# Patient Record
Sex: Female | Born: 1970 | Race: White | Hispanic: No | Marital: Married | Smoking: Former smoker
Health system: Southern US, Community
[De-identification: ages and names within clinical notes are randomized; demographics above are authoritative.]

## PROBLEM LIST (undated history)

## (undated) HISTORY — PX: TONSILLECTOMY: SUR1361

---

## 1998-01-22 ENCOUNTER — Other Ambulatory Visit: Admission: RE | Admit: 1998-01-22 | Discharge: 1998-01-22 | Payer: Self-pay | Admitting: Obstetrics and Gynecology

## 1999-02-01 ENCOUNTER — Other Ambulatory Visit: Admission: RE | Admit: 1999-02-01 | Discharge: 1999-02-01 | Payer: Self-pay | Admitting: Obstetrics and Gynecology

## 1999-09-04 ENCOUNTER — Inpatient Hospital Stay (HOSPITAL_COMMUNITY): Admission: AD | Admit: 1999-09-04 | Discharge: 1999-09-06 | Payer: Self-pay | Admitting: Obstetrics and Gynecology

## 1999-09-22 ENCOUNTER — Encounter: Admission: RE | Admit: 1999-09-22 | Discharge: 1999-10-27 | Payer: Self-pay | Admitting: *Deleted

## 1999-10-04 ENCOUNTER — Other Ambulatory Visit: Admission: RE | Admit: 1999-10-04 | Discharge: 1999-10-04 | Payer: Self-pay | Admitting: Obstetrics and Gynecology

## 2000-09-18 ENCOUNTER — Encounter: Admission: RE | Admit: 2000-09-18 | Discharge: 2000-11-03 | Payer: Self-pay | Admitting: Anesthesiology

## 2000-10-22 ENCOUNTER — Other Ambulatory Visit: Admission: RE | Admit: 2000-10-22 | Discharge: 2000-10-22 | Payer: Self-pay | Admitting: Obstetrics and Gynecology

## 2001-11-13 ENCOUNTER — Other Ambulatory Visit: Admission: RE | Admit: 2001-11-13 | Discharge: 2001-11-13 | Payer: Self-pay | Admitting: Obstetrics and Gynecology

## 2002-12-08 ENCOUNTER — Other Ambulatory Visit: Admission: RE | Admit: 2002-12-08 | Discharge: 2002-12-08 | Payer: Self-pay | Admitting: Obstetrics and Gynecology

## 2005-05-18 ENCOUNTER — Encounter: Admission: RE | Admit: 2005-05-18 | Discharge: 2005-05-18 | Payer: Self-pay | Admitting: Obstetrics and Gynecology

## 2006-07-17 ENCOUNTER — Emergency Department (HOSPITAL_COMMUNITY): Admission: EM | Admit: 2006-07-17 | Discharge: 2006-07-17 | Payer: Self-pay | Admitting: Family Medicine

## 2008-06-16 ENCOUNTER — Encounter: Admission: RE | Admit: 2008-06-16 | Discharge: 2008-06-16 | Payer: Self-pay | Admitting: Obstetrics and Gynecology

## 2009-06-18 ENCOUNTER — Encounter: Admission: RE | Admit: 2009-06-18 | Discharge: 2009-06-18 | Payer: Self-pay | Admitting: Obstetrics and Gynecology

## 2010-05-31 ENCOUNTER — Other Ambulatory Visit: Payer: Self-pay | Admitting: Obstetrics and Gynecology

## 2010-05-31 DIAGNOSIS — Z1231 Encounter for screening mammogram for malignant neoplasm of breast: Secondary | ICD-10-CM

## 2010-06-28 ENCOUNTER — Ambulatory Visit
Admission: RE | Admit: 2010-06-28 | Discharge: 2010-06-28 | Disposition: A | Discharge status: Home / Self Care | Payer: BC Managed Care – PPO | Source: Ambulatory Visit | Attending: Obstetrics and Gynecology | Admitting: Obstetrics and Gynecology

## 2010-06-28 DIAGNOSIS — Z1231 Encounter for screening mammogram for malignant neoplasm of breast: Secondary | ICD-10-CM

## 2010-07-22 NOTE — Procedures (Signed)
[facility]  Patient:    Gail Lindsey, Gail Lindsey                         MRN: [id number] Proc. Date: 09/26/00 Adm. Date:  [id number] Attending:  Thyra Breed CC:         Candy Sledge, M.D.   Procedure Report  PROCEDURE:  Piriformis trigger point injection.  DIAGNOSIS:  Buttock pain, most likely on the basis of piriformis syndrome.  INTERVAL HISTORY:  The patient noted marked improvement after her last visit where we injected the piriformis muscle.  We also partially blocked the sciatic nerve at her last visit.  She did well up until the point where she reduced the amount of Neurontin she was taking per day and, as a result, her symptoms have come back somewhat.  She has not been in to [facility] yet but plans to get involved with them.  PHYSICAL EXAMINATION:  Blood pressure 107/47.  Heart rate is 62.  Respiratory rate 18.  O2 saturations 100%.  The patient demonstrated a contusion from her previous injection site with neuro exam grossly unchanged.  DESCRIPTION OF PROCEDURE:  After informed consent was obtained, the patient was placed in the left lateral decubitus position with the left leg extended and her right flexed at the hip and knee.  A line was drawn from the right trochanteric region to the posterior superior iliac spine.  At the mid point of this line, a perpendicular line was drawn down to intersect the second line drawn from the trochanteric region down to the sacral hiatus.  Pressure over this area elicited discomfort.  It was identical to the place where she had previously been injected, as marked by the small contusion.  The area was marked and prepped with Betadine x 3.  Using a 27 gauge needle, I anesthetized the skin and subcutaneous structures with 1% lidocaine.  A 25 gauge spinal needle was introduced down to approximate the piriformis muscle.  I injected 6 mL of local anesthetic.  The local anesthetic consisted of 1%  lidocaine mixed with 0.5% levobupivacaine in a 1:1 ratio.  The needle was removed, and the patient noted reduction in her discomfort.  POSTPROCEDURE CONDITION:  Stable.  DISCHARGE INSTRUCTIONS: 1. Resume previous diet. 2. Limitations of activities per instruction sheet. 3. Continue on current medications. 4. The patient was encouraged to follow through with physical therapy. 5. Follow up with me in 1-2 weeks to consider a repeat injection.  The patient    was advised that there was no need to do the injections if she is not    getting physical therapy. DD:  09/26/00 TD:  09/26/00 Job: [id number]

## 2010-07-22 NOTE — H&P (Signed)
[place]  Patient:    Gail Lindsey, Gail Lindsey                         MRN: [id number] Adm. Date:  09/04/99 Attending:  Lenoard Aden, M.D. [facility]                         History and Physical  CHIEF COMPLAINT:  Worsening sciatica and favorable cervix, for induction.  HISTORY OF PRESENT ILLNESS:  The patient is a 40 year old white female, GII, PI, with EDD of 09/13/99 at 39-weeks gestation, for induction for favorable cervix nd worsening sciatica.  Pregnancy course uncomplicated.  ALLERGIES:  TETRACYCLINE and AMOXICILLIN.  MEDICATIONS:  Prenatal vitamins.  OBSTETRICAL HISTORY:  7 lb 8 oz female in 1998.  PAST SURGICAL HISTORY:  No other medical or surgical hospitalizations except for tonsillectomy.  FAMILY HISTORY:  Thyroidectomy and skin cancer.  PREGNANCY DATA:  Remarkable for blood type A positive, Rh antibody negative, toxo- titers negative, VDRL nonreactive, rubella immune, hepatitis-B surface antigen ______, GBS positive.  PHYSICAL EXAMINATION:  GENERAL:  Well-developed, well-nourished white female.  HEENT:  Normal.  LUNGS:  Clear.  HEART:  Regular rhythm.  ABDOMEN:  Soft, gravid, nontender; estimated fetal weight 7.5 pounds.  PELVIC:  Cervix 3 cm and 70% vertex and -1 station.  Artificial rupture of membranes - clear.  NEUROLOGIC:  Exam nonfocal.  IMPRESSION: 1. 39-week intrauterine pregnancy. 2. Worsening sciatica. 3. Favorable cervix.  PLAN:  Proceed with Pitocin induction. DD:  09/04/99 TD:  09/04/99 Job: [id number]

## 2010-07-22 NOTE — Procedures (Signed)
[facility]  Patient:    Gail Lindsey, Gail Lindsey                         MRN: [id number] Proc. Date: 10/10/00 Adm. Date:  [id number] Attending:  Thyra Breed CC:         Candy Sledge, M.D.   Procedure Report  PROCEDURE:  Piriformis trigger point injection of the right piriformis.  INTERVAL HISTORY:  The patient has been to integrative therapies and is getting physical therapy. She has done much better as a result of this. She has gotten some padding from [employer] which has helped also. She is doing exercises at home as well as going to physical therapy three times a week.  PHYSICAL EXAMINATION:  Blood pressure 115/77, heart rate 108, respiratory rate 18, O2 saturations 98%, pain level is 5/10. Her neuro exam is unchanged from her last visit. She is still tender over the right piriformis muscle.  DESCRIPTION OF PROCEDURE:  After informed consent was obtained, the patient was placed in the left lateral decubitus position with her left leg extended and right flexed at the hip and knee. I identified the piriformis region and marked the skin overlying this. The area was prepped with Betadine x 3. A skin wheal was raised with a 27 gauge needle using a 0.5 cc of 1% lidocaine. A 25 gauge needle was introduced to approximate the piriformis muscle and a total of 8 cc of local anesthetic was injected. The local anesthetic consisted of 4 cc of 1% lidocaine mixed with 4 cc of 0.5% levobupivacaine. The patient tolerated this well. She did develop a bit of a sciatic nerve block and was advised that she would need to be careful for the next 12-18 hours with regard to the nerve block. I plan to see her back in follow-up in three weeks for consideration for repeat block. In the meantime, she is to continue with her physical therapy. Her limitations were clearly outlined to her by the nurse assisting me and her father was made aware of the limitations. DD:   10/10/00 TD:  10/11/00 Job: [id number]

## 2010-07-22 NOTE — Procedures (Signed)
[facility]  Patient:    Gail Lindsey, Gail Lindsey Visit Number: [id number] MRN: [id number]          Service Type: PMG Location: [facility] Attending Physician:  Thyra Breed Proc. Date: 10/31/00 Adm. Date:  09/18/2000   CC:         Candy Sledge, M.D.   Procedure Report  PROCEDURE:  Right piriformis muscle injection.  DIAGNOSIS:  Piriformis syndrome.  INTERVAL HISTORY:  The patient has continued with physiotherapy and has noted that this has significantly reduced her pain.  She rates her pain at 3/10. She feels very encouraged with her progress to date.  PHYSICAL EXAMINATION:  The patient is afebrile with vital signs stable.  Neuro exam is grossly unchanged from her last visit.  DESCRIPTION OF PROCEDURE:  After informed consent was obtained, the patient was placed in the left lateral decubitus position with the left leg extended and her right flexed at the hip and knee.  A line was drawn from the greater trochanter to the posterior superior iliac spine.  At the mid point of this line, a perpendicular line was drawn to intersect the second line from the great trochanter to the sacral hiatus.  Pressure over this area elicited discomfort.  The area was marked and prepped with Betadine x 3.  A skin well was raised with a 27 gauge needle using 1% lidocaine.  A 25 gauge needle was introduced down to approximate the piriformis muscle.  I injected 4 cc of a local anesthetic.  Ten minutes later, the patient felt that she did not have a complete block, and I injected an additional 2 cc of local anesthetic. Following this, the patient noted good improvement in her pain, but she did have some weakness suggesting that she had a partial sciatic nerve block.  POSTPROCEDURE CONDITION:  Stable with weakness.  DISPOSITION: 1. Resume previous diet. 2. Limitations and activities as outlined.  She was advised that she should    not walk around and should just basically stay  in bed until the block wears    off through the course of today.  She is aware of the limitations. 3. Continue on current medications. 4. Follow up with me in six weeks for a repeat injection.  For the time being,    I advised her that I wanted her to continue with physiotherapy, as outlined    previously. Attending Physician:  Thyra Breed DD:  10/31/00 TD:  10/31/00 Job: [id number]

## 2010-07-22 NOTE — Consult Note (Signed)
[facility]  Patient:    Gail Lindsey, Gail Lindsey                         MRN: [id number] Proc. Date: 09/19/00 Adm. Date:  [id number] Attending:  Thyra Breed                          Consultation Report  NEW PATIENT EVALUATION AND PROCEDURE  HISTORY OF PRESENT ILLNESS:  Gail Lindsey is a very pleasant 40 year old who was sent to [place] by Dr. Noreene Filbert for evaluation and consideration of SI joint injection versus other interventional therapies for her bilateral buttock pain. The patient stated that she was in her usual state of health up until 1998 when she had an epidural placed for labor and developed a horrendous headache following this which required infusions of caffeine and eventually a blood patch. She had another labor in July of 2001 and shortly thereafter developed problems with buttock discomfort. Her epidural for that labor was noneventful and successful. About three to four days following delivery, she developed right buttock pain. She was evaluated by Dr. Noreene Filbert who placed her in physical therapy for piriformis syndrome and she improved. Following the physical therapy, she used a test ball for massage as well as her husband massaging her and took Darvocet p.r.n. About six months later, the patient noted that she was requiring two Darvocet per day and was concerned and was reevaluated by Dr. Noreene Filbert who started her on Neurontin 300 mg q. 8h which was not successful in controlling her discomfort and raised this to 400 mg q. 6h. This would take the edge off. She has had EMG and MRIs of her back which were essentially described as normal.  She describes her pain as a throbbing discomfort from the buttock region down the back of the legs to the ankles. There is sometimes numbness and tingling associated with this. She could not identify any exacerbating activities but does note that the Neurontin as well as massage over her buttock helped to reduce her  discomfort as well as the exercise she has been given. She denied any weakness or bowel or bladder incontinence. She does note that the pain is much worse in the evening and will often necessitate getting out of bed to do exercises before she can calm it down.  As a result of her pain, she is much more irritable with regard to caring for her children. She did have her postpartum course complicated by postpartum depression for which she took Zoloft.  CURRENT MEDICATIONS:  Zoloft, Neurontin 400 mg q. 6h, Triv[place], Centrum and calcium.  ALLERGIES:  The patient lists TETRACYCLINE as an allergy because she was given tetracycline as a child and it stained her teeth but otherwise denied any drug allergies.  FAMILY HISTORY:  Positive for coronary artery disease, thyroid disorder, melanoma and hypertension.  ACTIVE MEDICAL PROBLEMS:  Postpartum depression for which she is in the process of coming off of Zoloft.  SOCIAL HISTORY:  The patient drinks 12 ounces of beer per day and smokes two cigarettes per day. She is a Futures trader.  PAST SURGICAL HISTORY:  Significant for foot surgery as well as tonsillectomy.  REVIEW OF SYSTEMS:  GENERAL:  Significant for cold intolerance. HEAD: Negative. EYES:  Significant for floaters. NOSE/MOUTH/THROAT:  Negative. EARS:  Negative. PULMONARY: Negative. CARDIOVASCULAR:  Negative. GI: Significant for loose stools on Neurontin. GU:  Negative. MUSCULOSKELETAL AND NEUROLOGIC: See HPI. HEMATOLOGIC:  Negative. ENDOCRINE: Negative. CUTANEOUS:  Negative. PSYCHIATRIC: Positive for irritability and history of postpartum depression. ALLERGY/IMMUNOLOGIC:  Negative.  PHYSICAL EXAMINATION:  VITAL SIGNS:  Blood pressure 108/60, heart rate 64, respiratory rate 16, O2 saturations 98% and pain level is 5/10.  GENERAL:  This is a pleasant, healthy appearing female in no acute distress.  HEENT:  Head was normocephalic, atraumatic. Eyes, extraocular movements intact with  conjunctivae and sclerae clear. Nose patent nares without discharge. Oropharynx was free of lesions.  NECK:  Supple without lymphadenopathy. Carotids are 2+ and symmetric without bruits.  LUNGS:  Clear.  HEART:  Regular rate and rhythm.  BREASTS/ABDOMINAL/PELVIC/RECTAL:  Not performed.  BACK:  Revealed mild scoliosis predominantly in the thoracic spine with minimal tenderness over the SI joint today. She did exhibit tenderness over the sciatic notches bilaterally and pressure over the approximated piriformis muscles elicited tenderness right greater than left to a very significant extent.  Internal rotation of her legs did not exacerbate her discomfort.  EXTREMITIES:  No cyanosis, clubbing or edema with radial pulses and dorsalis pedis pulse 2+ and symmetric.  NEUROLOGIC:  The patient was oriented to person, place, time, and reason for visit.  Cranial nerves II-XII are grossly intact. Deep tendon reflexes were symmetric in the upper and lower extremity with downgoing toes. Motor was 5/5 with symmetric bulk and tone. Sensation was intact scratch sense and vibratory sense. Coordination was grossly intact.  IMPRESSION: 1. Bilateral buttock pain with radiation of pain down to the ankles which    suggest that there is some element of sciatic with normal nerve conduction    studies and normal MRI of the back suggesting this may be coming from    piriformis syndrome with history of good response to physical therapy. 2. Postpartum depression.  DISPOSITION: 1. I discussed with the patient treatment options and the mechanisms of what    is going on with her as closely as I could define it and both she and her    husbands questions were answered. I advised them that I would like to do a    trial of piriformis trigger point injections initially and get her    into physical therapy over at [facility] therapies. She does have an    element of scoliosis and I advised her that physical  therapy might help to    a mild extent with regard to this. She does exhibit some cold intolerance    today and I advised her that at some point she may need to get her thyroid     studies obtained. 2. She exhibited the exercises that she was counselled with and I encouraged    her to continue with this but would like to send her back to integrative    therapies or over to [facility] for another trial of physical therapy if    needs be. I advised that doing trigger point injections without physical    therapy tends to be counter productive. She is understanding of this    and realizes that she needs to go through with this. She will check with    her insurance and see if she is covered at integrative therapies and if    she is not, we will send her back to [facility]. 3. Piriformis injection. After informed consent was obtained, I placed the    patient in the left lateral decubitus position and monitored her. Her left    leg was extended and her right  flexed at the hip and knee. I identified    the greater trochanter and the posterior superior iliac spine. A line    drawn from these two point was made and at the midpoint of this line, a    perpendicular line was drawn to intersect her line from the greater    trochanter to the sacral hiatus. Pressure over this area elicited exquisite    discomfort and the area was marked. The area marked was prepped with    Betadine x 3 and then insufflated with a 27 gauge needle using 1%    lidocaine. A 25 gauge needle was introduced to approximate the piriformis    muscle on the right side. Aspiration was negative for blood. I injected    4 cc of local anesthetic. Ten minutes later, the patient noted incomplete    relief and I reprepped her and using a 25 gauge needle reapproximated the    piriformis muscle and injected an additional 3 cc of local anesthetic.    The patient noted improvement in her leg discomfort.  The local  anesthetic consisted of 1% lidocaine mixed with 0.5% levobupivacaine in a 1:1 ratio.  CONDITION POST PROCEDURE:  Stable.  DISCHARGE INSTRUCTIONS:  Resume previous diet. Limitations in activities per instruction sheet. Continue on current medications. Referral over to physical therapy. Followup with me in one week for repeat piriformis trigger point injection. DD:  09/19/00 TD:  09/19/00 Job: [id number]

## 2011-03-07 HISTORY — PX: INTRAUTERINE DEVICE (IUD) INSERTION: SHX5877

## 2011-05-22 ENCOUNTER — Other Ambulatory Visit: Payer: Self-pay | Admitting: Obstetrics and Gynecology

## 2011-05-22 DIAGNOSIS — Z1231 Encounter for screening mammogram for malignant neoplasm of breast: Secondary | ICD-10-CM

## 2011-06-29 ENCOUNTER — Ambulatory Visit
Admission: RE | Admit: 2011-06-29 | Discharge: 2011-06-29 | Disposition: A | Discharge status: Home / Self Care | Payer: BC Managed Care – PPO | Source: Ambulatory Visit | Attending: Obstetrics and Gynecology | Admitting: Obstetrics and Gynecology

## 2011-06-29 DIAGNOSIS — Z1231 Encounter for screening mammogram for malignant neoplasm of breast: Secondary | ICD-10-CM

## 2012-05-31 ENCOUNTER — Other Ambulatory Visit: Payer: Self-pay

## 2012-05-31 DIAGNOSIS — Z1231 Encounter for screening mammogram for malignant neoplasm of breast: Secondary | ICD-10-CM

## 2012-07-02 ENCOUNTER — Ambulatory Visit
Admission: RE | Admit: 2012-07-02 | Discharge: 2012-07-02 | Disposition: A | Discharge status: Home / Self Care | Payer: BC Managed Care – PPO | Source: Ambulatory Visit

## 2012-07-02 DIAGNOSIS — Z1231 Encounter for screening mammogram for malignant neoplasm of breast: Secondary | ICD-10-CM

## 2012-07-04 ENCOUNTER — Other Ambulatory Visit: Payer: Self-pay | Admitting: Obstetrics and Gynecology

## 2012-07-04 DIAGNOSIS — R928 Other abnormal and inconclusive findings on diagnostic imaging of breast: Secondary | ICD-10-CM

## 2012-07-18 ENCOUNTER — Ambulatory Visit
Admission: RE | Admit: 2012-07-18 | Discharge: 2012-07-18 | Disposition: A | Discharge status: Home / Self Care | Payer: BC Managed Care – PPO | Source: Ambulatory Visit | Attending: Family Medicine | Admitting: Family Medicine

## 2012-07-18 ENCOUNTER — Other Ambulatory Visit: Payer: Self-pay | Admitting: Family Medicine

## 2012-07-18 DIAGNOSIS — R221 Localized swelling, mass and lump, neck: Secondary | ICD-10-CM

## 2012-07-19 ENCOUNTER — Ambulatory Visit
Admission: RE | Admit: 2012-07-19 | Discharge: 2012-07-19 | Disposition: A | Discharge status: Home / Self Care | Payer: BC Managed Care – PPO | Source: Ambulatory Visit | Attending: Obstetrics and Gynecology | Admitting: Obstetrics and Gynecology

## 2012-07-19 DIAGNOSIS — R928 Other abnormal and inconclusive findings on diagnostic imaging of breast: Secondary | ICD-10-CM

## 2013-04-08 ENCOUNTER — Emergency Department
Admission: EM | Admit: 2013-04-08 | Discharge: 2013-04-08 | Disposition: A | Discharge status: Home / Self Care | Payer: BC Managed Care – PPO | Source: Home / Self Care | Attending: Family Medicine | Admitting: Family Medicine

## 2013-04-08 ENCOUNTER — Encounter: Payer: Self-pay | Admitting: Emergency Medicine

## 2013-04-08 DIAGNOSIS — R5381 Other malaise: Secondary | ICD-10-CM

## 2013-04-08 DIAGNOSIS — R531 Weakness: Secondary | ICD-10-CM

## 2013-04-08 DIAGNOSIS — R5383 Other fatigue: Secondary | ICD-10-CM

## 2013-04-08 LAB — POCT MONO SCREEN (KUC): Mono, POC: NEGATIVE

## 2013-04-08 LAB — POCT INFLUENZA A/B
INFLUENZA A, POC: NEGATIVE
INFLUENZA B, POC: NEGATIVE

## 2013-04-08 LAB — POCT CBC W AUTO DIFF (K'VILLE URGENT CARE)

## 2013-04-08 LAB — POCT RAPID STREP A (OFFICE): RAPID STREP A SCREEN: NEGATIVE

## 2013-04-08 MED ORDER — PREDNISONE 50 MG PO TABS: ORAL_TABLET | ORAL | Status: DC

## 2013-04-08 NOTE — ED Provider Notes (Signed)
CSN: [id number]     Arrival date & time 04/08/13  1105 History   First MD Initiated Contact with Patient 04/08/13 1111     Chief Complaint  Patient presents with  . Fatigue  . Generalized Body Aches  . Headache    HPI   Pt presents today with chief complaint of weakness and fatigue.  Pt states that she has had generalized weakness and fatigue over the past 1-2 weeks.  Denies any recent infections.  States that everything hurts all over.  Pt states that she can barely get out of bed every morning.  States that she is otherwise very healthy and active.  No known trauma.  No new medications.  No diarrhea, dysuria, abd pain, nausea, vomiting  Has had some mild frontal headache associated with sxs.  Talked about her sxs with local pharmacist because she thought she had the flu.  Was told that sxs were not consistent with flu.  Was told to take tylenol.  This has been mildly effective in helping sxs.  No numbness or paresthesias.  Denies any family hx/o autoimmune disease or cancer.  Mammogram UTD per pt-normal.  Denies any depressive sxs.   History reviewed. No pertinent past medical history. Past Surgical History  Procedure Laterality Date  . Tonsillectomy     Family History  Problem Relation Age of Onset  . COPD Father    History  Substance Use Topics  . Smoking status: Former Research scientist (life sciences)  . Smokeless tobacco: Not on file  . Alcohol Use: Yes   OB History   Grav Para Term Preterm Abortions TAB SAB Ect Mult Living                 Review of Systems  All other systems reviewed and are negative.    Allergies  Nitrofuran derivatives and Tetracyclines & related  Home Medications   Current Outpatient Rx  Name  Route  Sig  Dispense  Refill  . aspirin 81 MG tablet   Oral   Take 81 mg by mouth daily.         Marland Kitchen BIOTIN PO   Oral   Take by mouth.         Marland Kitchen glucosamine-chondroitin 500-400 MG tablet   Oral   Take 1 tablet by mouth 3 (three) times daily.           BP 115/78  Pulse 78  Temp(Src) 97 F (36.1 C) (Oral)  Resp 18  Ht 5' 7"  (1.702 m)  Wt 136 lb (61.689 kg)  BMI 21.30 kg/m2  SpO2 99% Physical Exam  Constitutional: She appears well-developed and well-nourished.  HENT:  Head: Normocephalic and atraumatic.  Eyes: Conjunctivae are normal. Pupils are equal, round, and reactive to light.  Neck: Normal range of motion. Neck supple.  Cardiovascular: Normal rate and regular rhythm.   Pulmonary/Chest: Effort normal and breath sounds normal.  Abdominal: Soft.  Neurological: She is alert.  Skin: Skin is warm.    ED Course  Procedures (including critical care time) Labs Review Labs Reviewed  RESPIRATORY VIRUS PANEL  COMPREHENSIVE METABOLIC PANEL  TSH  CK  SEDIMENTATION RATE  ROCKY MTN SPOTTED FVR AB, IGG-BLOOD  ROCKY MTN SPOTTED FVR AB, IGM-BLOOD  B. BURGDORFI ANTIBODIES  B. BURGDORFI ANTIBODIES BY WB  ANA  POCT CBC W AUTO DIFF ([facility])  POCT INFLUENZA A/B  POCT RAPID STREP A (OFFICE)  POCT MONO SCREEN ([facility])   Imaging Review No results found.    MDM  1. Fatigue   2. Weakness    Ddx for sxs is very broad including autoimmune, metabolic, infectious causes.  CBC, flu, mono, strep negative today.  WIll check baseline labs including  TSH, CMET, ESR, CK, RMSF, ANA, resp virus panel.  Will place on short course of prednisone for symptomatic treatment.  Discussed general and symptomatic red flags at length.  Also needs to establish PCP.  Follow up pending bloodwork.     Shanda Howells, MD 04/08/13 [phone]

## 2013-04-08 NOTE — ED Notes (Signed)
Pt c/o fatigue, body aches, decreased appetite, and HA x 2 wks.

## 2013-04-09 LAB — COMPREHENSIVE METABOLIC PANEL
ALT: 12 U/L (ref 0–35)
AST: 16 U/L (ref 0–37)
Albumin: 3.9 g/dL (ref 3.5–5.2)
Alkaline Phosphatase: 36 U/L — ABNORMAL LOW (ref 39–117)
BUN: 10 mg/dL (ref 6–23)
CALCIUM: 9.4 mg/dL (ref 8.4–10.5)
CO2: 26 mEq/L (ref 19–32)
Chloride: 104 mEq/L (ref 96–112)
Creat: 0.79 mg/dL (ref 0.50–1.10)
Glucose, Bld: 79 mg/dL (ref 70–99)
POTASSIUM: 4.7 meq/L (ref 3.5–5.3)
Sodium: 136 mEq/L (ref 135–145)
TOTAL PROTEIN: 6.5 g/dL (ref 6.0–8.3)
Total Bilirubin: 0.6 mg/dL (ref 0.2–1.2)

## 2013-04-09 LAB — VITAMIN B12: Vitamin B-12: 355 pg/mL (ref 211–911)

## 2013-04-09 LAB — TSH: TSH: 1.392 u[IU]/mL (ref 0.350–4.500)

## 2013-04-09 LAB — CK: CK TOTAL: 61 U/L (ref 7–177)

## 2013-04-09 LAB — B. BURGDORFI ANTIBODIES BY WB
B BURGDORFERI IGG ABS (IB): NEGATIVE
B burgdorferi IgM Abs (IB): NEGATIVE

## 2013-04-09 LAB — ANA: Anti Nuclear Antibody(ANA): NEGATIVE

## 2013-04-09 LAB — B. BURGDORFI ANTIBODIES: B BURGDORFERI AB IGG+ IGM: 1.11 {ISR} — AB

## 2013-04-09 LAB — SEDIMENTATION RATE: SED RATE: 1 mm/h (ref 0–22)

## 2013-04-11 ENCOUNTER — Telehealth: Payer: Self-pay | Admitting: *Deleted

## 2013-04-11 LAB — ROCKY MTN SPOTTED FVR AB, IGG-BLOOD: RMSF IgG: 0.12 IV

## 2013-04-11 LAB — VITAMIN D PNL(25-HYDRXY+1,25-DIHY)-BLD
VITAMIN D3 1, 25 (OH): 55 pg/mL
Vit D, 25-Hydroxy: 60 ng/mL (ref 30–89)
Vitamin D 1, 25 (OH)2 Total: 55 pg/mL (ref 18–72)

## 2013-04-11 LAB — ROCKY MTN SPOTTED FVR AB, IGM-BLOOD: ROCKY MTN SPOTTED FEVER, IGM: 0.5 IV

## 2013-04-28 ENCOUNTER — Ambulatory Visit (INDEPENDENT_AMBULATORY_CARE_PROVIDER_SITE_OTHER): Payer: BC Managed Care – PPO | Admitting: Infectious Diseases

## 2013-04-28 ENCOUNTER — Other Ambulatory Visit: Payer: Self-pay | Admitting: Infectious Diseases

## 2013-04-28 ENCOUNTER — Encounter: Payer: Self-pay | Admitting: Infectious Diseases

## 2013-04-28 VITALS — BP 125/79 | HR 70 | Temp 98.2°F | Ht 67.0 in | Wt 135.0 lb

## 2013-04-28 DIAGNOSIS — R5383 Other fatigue: Principal | ICD-10-CM

## 2013-04-28 DIAGNOSIS — R5381 Other malaise: Secondary | ICD-10-CM

## 2013-04-28 NOTE — Progress Notes (Signed)
   Subjective:    Patient ID: Gail Lindsey, female    DOB: [birth year], 43 y.o.   MRN: [id number]  HPI 43 yo F with hx of hx of weakness, body aches, and fatigue for 3 months. Aches were mostly waist down. Was having headaches for the prior 2 weeks (now resolved). Also having pain, burning sensation in her legs. This was a significant change for her in that she is usually very high energy.  Thought she had flu- but did not have URI sx. She was seen at urgent care- was tested for mono, strep, flu; all negative. She was started on prednisone which she took for 5 days. She had tests for Lyme+ and was took doxy for 3 days, felt no better.  CK, TSH, ESR, RMSF, ANA all normal/negative. Lyme elisa+, western blot (-).   PMHx- none Pyriformis syndrome after she had her oldest child, managed at [facility] (resolved).   SocHx/FHx reviewed.  Paternal grandmother had arthritis, father- arthritis, copd. Mother 9 still works full time. Maternal uncle melanoma.   Review of Systems  Constitutional: Positive for activity change. Negative for fever, chills, appetite change and unexpected weight change.  Gastrointestinal: Negative for diarrhea and constipation.  Genitourinary: Negative for difficulty urinating.  Musculoskeletal: Negative for joint swelling.  Hematological: Negative for adenopathy.      Objective:   Physical Exam  Constitutional: She is oriented to person, place, and time. She appears well-developed and well-nourished.  HENT:  Mouth/Throat: No oropharyngeal exudate.  Eyes: EOM are normal. Pupils are equal, round, and reactive to light.  Neck: Neck supple.  Cardiovascular: Normal rate, regular rhythm and normal heart sounds.   Pulmonary/Chest: Effort normal and breath sounds normal.  Abdominal: Soft. Bowel sounds are normal. There is no tenderness. There is no rebound.  Musculoskeletal: She exhibits no edema.  Lymphadenopathy:    She has no cervical adenopathy.    She has no  axillary adenopathy.  Neurological: She is alert and oriented to person, place, and time.  Skin: Skin is warm and dry.          Assessment & Plan:

## 2013-04-28 NOTE — Assessment & Plan Note (Addendum)
Spoke with her and her husband at length regarding ddx. Her lab test so far have been unreveling. I explained her lab tests to her and her husband. She had a false+ Lyme elisa, it would be extremely uncommon for her to have Lyme (not endmic in [place] and the test was done in winter when the ticks are hibernating). There is a wide ddx though- she would like to be screened for mono-like illnesses. I also offered to her that this could be menopause. She also asks to be evaluated for multiple sclerosis. I spoke with them at length about this, this is possible but would be out of my diagnostic abilities.  - I will call her back in 1 week. I offered to have her f/u with her PCP to arrange her referrals but sh prefered to have me arrange them.   -She will f/u with her gyn to be eval for menopause.  -She asks for a referral to neuro, I will arrange this.

## 2013-04-29 LAB — EPSTEIN-BARR VIRUS VCA ANTIBODY PANEL
EBV EA IgG: 25 U/mL — ABNORMAL HIGH (ref <9.0)
EBV NA IgG: >600 U/mL — ABNORMAL HIGH (ref <18.0)
EBV VCA IgG: >750 U/mL — ABNORMAL HIGH (ref <18.0)
EBV VCA IgM: <10 U/mL (ref <36.0)

## 2013-04-29 LAB — CMV IGM: CMV IgM: 17.2 AU/mL (ref <30.00)

## 2013-04-29 LAB — TOXOPLASMA GONDII ANTIBODY, IGG: Toxoplasma IgG Ratio: <3 IU/mL (ref <7.2)

## 2013-04-29 LAB — RPR

## 2013-04-29 LAB — TOXOPLASMA GONDII ANTIBODY, IGM

## 2013-05-01 LAB — CYTOMEGALOVIRUS ANTIBODY, IGG: Cytomegalovirus Ab-IgG: <0.2 U/mL (ref <0.60)

## 2013-05-12 ENCOUNTER — Telehealth: Payer: Self-pay | Admitting: Infectious Diseases

## 2013-05-12 ENCOUNTER — Telehealth: Payer: Self-pay | Admitting: *Deleted

## 2013-05-12 NOTE — Telephone Encounter (Signed)
Patient is calling for lab results and interpretation.  She would like to know if there is a need for the neurology referral, or if she should follow up with her GYN d/t hormone regulation.  Patient is requesting a phone call from Dr. Ninetta LightsHatcher.  You can call her at [phone], ok to leave a message. Andree CossHowell, Gulianna Hornsby M, RN

## 2013-05-12 NOTE — Telephone Encounter (Signed)
Attempted to call pt, here mailbox was full.

## 2013-05-13 ENCOUNTER — Telehealth: Payer: Self-pay | Admitting: Infectious Diseases

## 2013-05-13 NOTE — Telephone Encounter (Signed)
Spoke with pt re: her tests. She has been feeling better- less fatigue.  Her EBV Ig were elevated, she could have had mono-like illness from EBV.  She has had w/u for menopause from her OB- FSH 22. 1 (slightly over cutoff), AMH (0.226) peri-menopausal.  She is still having paresthesias- has been improving. If she still has at the end of the month, she will see Neurology.  I will be available if prn.

## 2013-05-14 ENCOUNTER — Telehealth: Payer: Self-pay | Admitting: *Deleted

## 2013-05-14 NOTE — Telephone Encounter (Signed)
Patient called requesting a copy of her lab work mailed to her home, mailed today. She said she did an Therapist, artinternet search on EBV and has questions for the MD. She is requesting that Dr. Ninetta LightsHatcher call her at [phone] when he returns on Monday. Gail Lindsey

## 2013-05-26 ENCOUNTER — Telehealth: Payer: Self-pay | Admitting: Infectious Diseases

## 2013-05-26 NOTE — Telephone Encounter (Signed)
Pt called to discuss her labs. I called her office # that she left, left message.

## 2013-05-27 ENCOUNTER — Telehealth: Payer: Self-pay | Admitting: *Deleted

## 2013-05-27 NOTE — Telephone Encounter (Signed)
Referral is "in process" [facility] to [facility].  Left message with the with the same information.

## 2013-05-28 NOTE — Telephone Encounter (Signed)
Called pt, no response on her cell or work #s. No message left.

## 2013-06-17 ENCOUNTER — Other Ambulatory Visit: Payer: Self-pay

## 2013-06-17 DIAGNOSIS — Z1231 Encounter for screening mammogram for malignant neoplasm of breast: Secondary | ICD-10-CM

## 2013-07-21 ENCOUNTER — Ambulatory Visit
Admission: RE | Admit: 2013-07-21 | Discharge: 2013-07-21 | Disposition: A | Discharge status: Home / Self Care | Payer: BC Managed Care – PPO | Source: Ambulatory Visit

## 2013-07-21 ENCOUNTER — Encounter (INDEPENDENT_AMBULATORY_CARE_PROVIDER_SITE_OTHER): Payer: Self-pay

## 2013-07-21 DIAGNOSIS — Z1231 Encounter for screening mammogram for malignant neoplasm of breast: Secondary | ICD-10-CM

## 2013-08-27 ENCOUNTER — Encounter: Payer: Self-pay | Admitting: Family Medicine

## 2013-08-27 ENCOUNTER — Ambulatory Visit (INDEPENDENT_AMBULATORY_CARE_PROVIDER_SITE_OTHER): Payer: BC Managed Care – PPO | Admitting: Family Medicine

## 2013-08-27 VITALS — BP 116/79 | HR 68 | Temp 98.0°F | Resp 14 | Ht 67.0 in | Wt 136.0 lb

## 2013-08-27 DIAGNOSIS — H9209 Otalgia, unspecified ear: Secondary | ICD-10-CM

## 2013-08-27 DIAGNOSIS — Z13220 Encounter for screening for lipoid disorders: Secondary | ICD-10-CM

## 2013-08-27 DIAGNOSIS — H9203 Otalgia, bilateral: Secondary | ICD-10-CM

## 2013-08-27 DIAGNOSIS — Z0000 Encounter for general adult medical examination without abnormal findings: Secondary | ICD-10-CM

## 2013-08-27 DIAGNOSIS — Z131 Encounter for screening for diabetes mellitus: Secondary | ICD-10-CM

## 2013-08-27 MED ORDER — ANTIPYRINE-BENZOCAINE 5.4-1.4 % OT SOLN: OTIC | Status: AC

## 2013-08-27 NOTE — Patient Instructions (Signed)
Dr. Hommel's General Advice Following Your Complete Physical Exam  The Benefits of Regular Exercise: Unless you suffer from an uncontrolled cardiovascular condition, studies strongly suggest that regular exercise and physical activity will add to both the quality and length of your life.  The World Health Organization recommends 150 minutes of moderate intensity aerobic activity every week.  This is best split over 3-4 days a week, and can be as simple as a brisk walk for just over 35 minutes "most days of the week".  This type of exercise has been shown to lower LDL-Cholesterol, lower average blood sugars, lower blood pressure, lower cardiovascular disease risk, improve memory, and increase one's overall sense of wellbeing.  The addition of anaerobic (or "strength training") exercises offers additional benefits including but not limited to increased metabolism, prevention of osteoporosis, and improved overall cholesterol levels.  How Can I Strive For A Low-Fat Diet?: Current guidelines recommend that 25-35 percent of your daily energy (food) intake should come from fats.  One might ask how can this be achieved without having to dissect each meal on a daily basis?  Switch to skim or 1% milk instead of whole milk.  Focus on lean meats such as ground turkey, fresh fish, baked chicken, and lean cuts of beef as your source of dietary protein.  Limit saturated fat consumption to less than 10% of your daily caloric intake.  Limit trans fatty acid consumption primarily by limiting synthetic trans fats such as partially hydrogenated oils (Ex: fried fast foods).  Substitute olive or vegetable oil for solid fats where possible.  Moderation of Salt Intake: Provided you don't carry a diagnosis of congestive heart failure nor renal failure, I recommend a daily allowance of no more than 2300 mg of salt (sodium).  Keeping under this daily goal is associated with a decreased risk of cardiovascular events, creeping  above it can lead to elevated blood pressures and increases your risk of cardiovascular events.  Milligrams (mg) of salt is listed on all nutrition labels, and your daily intake can add up faster than you think.  Most canned and frozen dinners can pack in over half your daily salt allowance in one meal.    Lifestyle Health Risks: Certain lifestyle choices carry specific health risks.  As you may already know, tobacco use has been associated with increasing one's risk of cardiovascular disease, pulmonary disease, numerous cancers, among many other issues.  What you may not know is that there are medications and nicotine replacement strategies that can more than double your chances of successfully quitting.  I would be thrilled to help manage your quitting strategy if you currently use tobacco products.  When it comes to alcohol use, I've yet to find an "ideal" daily allowance.  Provided an individual does not have a medical condition that is exacerbated by alcohol consumption, general guidelines determine "safe drinking" as no more than two standard drinks for a man or no more than one standard drink for a female per day.  However, much debate still exists on whether any amount of alcohol consumption is technically "safe".  My general advice, keep alcohol consumption to a minimum for general health promotion.  If you or others believe that alcohol, tobacco, or recreational drug use is interfering with your life, I would be happy to provide confidential counseling regarding treatment options.  General "Over The Counter" Nutrition Advice: Postmenopausal women should aim for a daily calcium intake of 1200 mg, however a significant portion of this might already be   provided by diets including milk, yogurt, cheese, and other dairy products.  Vitamin D has been shown to help preserve bone density, prevent fatigue, and has even been shown to help reduce falls in the elderly.  Ensuring a daily intake of 800 Units of  Vitamin D is a good place to start to enjoy the above benefits, we can easily check your Vitamin D level to see if you'd potentially benefit from supplementation beyond 800 Units a day.  Folic Acid intake should be of particular concern to women of childbearing age.  Daily consumption of 400-800 mcg of Folic Acid is recommended to minimize the chance of spinal cord defects in a fetus should pregnancy occur.    For many adults, accidents still remain one of the most common culprits when it comes to cause of death.  Some of the simplest but most effective preventitive habits you can adopt include regular seatbelt use, proper helmet use, securing firearms, and regularly testing your smoke and carbon monoxide detectors.  Sean B. Hommel DO [facility] [address], [address] [place], [place] [address] Phone: [phone]  

## 2013-08-27 NOTE — Progress Notes (Signed)
CC: Gail Gail Lindsey is a 43 y.o. female is here for Establish Care and Annual Exam   Subjective: HPI:  Colonoscopy: No family history of colon cancer encouraged to get screening colonoscopy age 43 Papsmear: Gets this annually in August with her GYN office, last few years have had no abnormalities but it sounds like she did have an abnormality that did not require biopsy years ago. Mammogram: Gets this annually through her GYN office, most recently normal in June    Influenza Vaccine: No current indication encouraged to get this done during flu season Pneumovax: No current indication Td/Tdap: She's up to date until 2022 Zoster: (Start 45 yo)  Very pleasant 43 year old here to establish care. Requesting complete physical exam with her only acute complaint being approximately 3 years of bilateral ear itching. She has seen multiple ear nose and throat physicians and has tried topical steroids, nasal steroids, topical analgesics in a cream base form. She has itching is mild to moderate in severity on a daily basis only received by scratching with a Q-tip. It is localized deep in the canal. It occurred the day after she surfaced during a long scuba dive trip.  Rare alcohol use no tobacco or recreational drug use  Review of Systems - General ROS: negative for - chills, fever, night sweats, weight gain or weight loss Ophthalmic ROS: negative for - decreased vision Psychological ROS: negative for - anxiety or depression ENT ROS: negative for - hearing change, nasal congestion, tinnitus or allergies Hematological and Lymphatic ROS: negative for - bleeding problems, bruising or swollen lymph nodes Breast ROS: negative Respiratory ROS: no cough, shortness of breath, or wheezing Cardiovascular ROS: no chest pain or dyspnea on exertion Gastrointestinal ROS: no abdominal pain, change in bowel habits, or black or bloody stools Genito-Urinary ROS: negative for - genital discharge, genital ulcers,  incontinence or abnormal bleeding from genitals Musculoskeletal ROS: negative for - joint pain or muscle pain Neurological ROS: negative for - headaches or memory loss Dermatological ROS: negative for lumps, mole changes, rash and skin lesion changes  No past medical history on file.  Past Surgical History  Procedure Laterality Date  . Tonsillectomy     Family History  Problem Relation Age of Onset  . COPD Father     History   Social History  . Marital Status: Married    Spouse Name: N/A    Number of Children: N/A  . Years of Education: N/A   Occupational History  . Not on file.   Social History Main Topics  . Smoking status: Former Games developermoker  . Smokeless tobacco: Not on file  . Alcohol Use: Yes  . Drug Use: No  . Sexual Activity: Not on file   Other Topics Concern  . Not on file   Social History Narrative  . No narrative on file     Objective: BP 116/79  Pulse 68  Temp(Src) 98 F (36.7 C) (Oral)  Resp 14  Ht 5\' 7"  (1.702 m)  Wt 136 lb (61.689 kg)  BMI 21.30 kg/m2  General: No Acute Distress HEENT: Atraumatic, normocephalic, conjunctivae normal without scleral icterus.  No nasal discharge, hearing grossly intact, TMs with good landmarks bilaterally with no middle ear abnormalities, posterior pharynx clear without oral lesions. Neck: Supple, trachea midline, no cervical nor supraclavicular adenopathy. Pulmonary: Clear to auscultation bilaterally without wheezing, rhonchi, nor rales. Cardiac: Regular rate and rhythm.  No murmurs, rubs, nor gallops. No peripheral edema.  2+ peripheral pulses bilaterally. Abdomen: Bowel sounds normal.  No masses.  Non-tender without rebound.  Negative Murphy's sign. GU: Deferred to her GYN visits  MSK: Grossly intact, no signs of weakness.  Full strength throughout upper and lower extremities.  Full ROM in upper and lower extremities.  No midline spinal tenderness. Neuro: Gait unremarkable, CN II-XII grossly intact.  C5-C6 Reflex 2/4  Bilaterally, L4 Reflex 2/4 Bilaterally.  Cerebellar function intact. Skin: No rashes. Psych: Alert and oriented to person/place/time.  Thought process normal. No anxiety/depression.   Assessment & Plan: Gail Gail Lindsey was seen today for establish care and annual exam.  Diagnoses and associated orders for this visit:  Annual physical exam  Lipid screening - Lipid panel  Diabetes mellitus screening - BASIC METABOLIC PANEL WITH GFR  Ear pain, bilateral - antipyrine-benzocaine (AURALGAN) otic solution; Two to four drops in affected ear(s) every 1-2 hours only as needed for pain.    Healthy lifestyle interventions including but not limited to regular exercise, a healthy low fat diet, moderation of salt intake, the dangers of tobacco/alcohol/recreational drug use, nutrition supplementation, and accident avoidance were discussed with the patient and a handout was provided for future reference. She's due for routine dyslipidemia and diabetic screening Advised her that she's used the majority of minor mentions for ear discomfort however could try auralgan until she can followup with her ear nose and throat specialist about this matter   Return in about 1 year (around 08/28/2014) for annual physical.

## 2013-08-28 ENCOUNTER — Encounter: Payer: Self-pay | Admitting: *Deleted

## 2013-08-28 ENCOUNTER — Encounter: Payer: Self-pay | Admitting: Family Medicine

## 2013-08-28 LAB — LIPID PANEL
CHOLESTEROL: 195 mg/dL (ref 0–200)
HDL: 53 mg/dL (ref >39)
LDL Cholesterol: 126 mg/dL — ABNORMAL HIGH (ref 0–99)
TRIGLYCERIDES: 82 mg/dL (ref <150)
Total CHOL/HDL Ratio: 3.7 Ratio
VLDL: 16 mg/dL (ref 0–40)

## 2013-08-28 LAB — BASIC METABOLIC PANEL WITH GFR
BUN: 9 mg/dL (ref 6–23)
CO2: 30 meq/L (ref 19–32)
Calcium: 9.4 mg/dL (ref 8.4–10.5)
Chloride: 101 mEq/L (ref 96–112)
Creat: 0.67 mg/dL (ref 0.50–1.10)
GFR, Est African American: >89 mL/min
GFR, Est Non African American: >89 mL/min
GLUCOSE: 76 mg/dL (ref 70–99)
Potassium: 4.2 mEq/L (ref 3.5–5.3)
Sodium: 137 mEq/L (ref 135–145)

## 2013-08-29 NOTE — Progress Notes (Signed)
Patient informed about lab results and understood. SI

## 2013-09-22 ENCOUNTER — Ambulatory Visit (INDEPENDENT_AMBULATORY_CARE_PROVIDER_SITE_OTHER): Payer: BC Managed Care – PPO | Admitting: Family Medicine

## 2013-09-22 ENCOUNTER — Encounter: Payer: Self-pay | Admitting: Family Medicine

## 2013-09-22 VITALS — BP 115/72 | HR 83 | Temp 98.1°F | Ht 67.0 in | Wt 139.0 lb

## 2013-09-22 DIAGNOSIS — N390 Urinary tract infection, site not specified: Secondary | ICD-10-CM

## 2013-09-22 DIAGNOSIS — B9689 Other specified bacterial agents as the cause of diseases classified elsewhere: Secondary | ICD-10-CM

## 2013-09-22 DIAGNOSIS — R350 Frequency of micturition: Secondary | ICD-10-CM

## 2013-09-22 DIAGNOSIS — A499 Bacterial infection, unspecified: Secondary | ICD-10-CM

## 2013-09-22 LAB — POCT URINALYSIS DIPSTICK
Bilirubin, UA: NEGATIVE
Glucose, UA: NEGATIVE
Nitrite, UA: POSITIVE
SPEC GRAV UA: 1.025
Urobilinogen, UA: 0.2
pH, UA: 6.5

## 2013-09-22 MED ORDER — SULFAMETHOXAZOLE-TRIMETHOPRIM 800-160 MG PO TABS: ORAL_TABLET | ORAL | Status: AC

## 2013-09-22 MED ORDER — SULFAMETHOXAZOLE-TRIMETHOPRIM 800-160 MG PO TABS: ORAL_TABLET | ORAL | Status: DC

## 2013-09-22 NOTE — Progress Notes (Signed)
Pt came in today for a nurse visit complaining of frequent urination, burning when urinate, blood in urine and a strong odor x 1 day. Pain is a 7./Georgette Helmer,CMA

## 2013-09-22 NOTE — Progress Notes (Signed)
Urinalysis and symptoms are suggestive of UTI therefore Bactrim has been prescribed I will follow culture.

## 2013-09-23 NOTE — Progress Notes (Signed)
Pt was informed while in the office./Missi Mcmackin,CMA

## 2013-09-25 LAB — URINE CULTURE: Colony Count: >=100000

## 2013-11-21 ENCOUNTER — Encounter: Payer: Self-pay | Admitting: Emergency Medicine

## 2013-11-21 ENCOUNTER — Emergency Department
Admission: EM | Admit: 2013-11-21 | Discharge: 2013-11-21 | Disposition: A | Discharge status: Home / Self Care | Payer: BC Managed Care – PPO | Source: Home / Self Care | Attending: Family Medicine | Admitting: Family Medicine

## 2013-11-21 DIAGNOSIS — J329 Chronic sinusitis, unspecified: Secondary | ICD-10-CM

## 2013-11-21 DIAGNOSIS — B9789 Other viral agents as the cause of diseases classified elsewhere: Secondary | ICD-10-CM

## 2013-11-21 DIAGNOSIS — J208 Acute bronchitis due to other specified organisms: Secondary | ICD-10-CM

## 2013-11-21 DIAGNOSIS — J209 Acute bronchitis, unspecified: Secondary | ICD-10-CM

## 2013-11-21 LAB — POCT RAPID STREP A (OFFICE): RAPID STREP A SCREEN: NEGATIVE

## 2013-11-21 MED ORDER — AMOXICILLIN-POT CLAVULANATE 875-125 MG PO TABS: 1.0000 | ORAL_TABLET | Freq: Two times a day (BID) | ORAL | Status: DC

## 2013-11-21 MED ORDER — PREDNISONE 10 MG PO TABS: 30.0000 mg | ORAL_TABLET | Freq: Every day | ORAL | Status: DC

## 2013-11-21 MED ORDER — IPRATROPIUM BROMIDE 0.06 % NA SOLN: 2.0000 | Freq: Four times a day (QID) | NASAL | Status: DC

## 2013-11-21 MED ORDER — PROMETHAZINE-CODEINE 6.25-10 MG/5ML PO SYRP
5.0000 mL | ORAL_SOLUTION | Freq: Every evening | ORAL | Status: DC | PRN
Start: 2013-11-21 — End: 2014-10-19

## 2013-11-21 NOTE — ED Notes (Signed)
S/s of cough with pain to chest area when coughing only, runny nose, congestion, sore throat, headache and body aches for 4 days.

## 2013-11-21 NOTE — ED Provider Notes (Signed)
Gail Lindsey is a 43 y.o. female who presents to Urgent Care today for sore throat. Patient is a 38 history of sore throat postnasal drip fatigue nonproductive cough fevers and chills associated with a headache. Patient edition and running nose and sneezing. She's tried Alka-Seltzer which helps some. No nausea vomiting diarrhea. No shortness of breath or wheezing.   History reviewed. No pertinent past medical history. History  Substance Use Topics  . Smoking status: Former Games developer  . Smokeless tobacco: Not on file  . Alcohol Use: 1.5 oz/week    3 drink(s) per week   ROS as above Medications: No current facility-administered medications for this encounter.   Current Outpatient Prescriptions  Medication Sig Dispense Refill  . cetirizine (ZYRTEC) 10 MG tablet Take 10 mg by mouth daily.      Marland Kitchen Phenyleph-CPM-DM-Aspirin (ALKA-SELTZER PLUS COLD & COUGH PO) Take by mouth.      Marland Kitchen amoxicillin-clavulanate (AUGMENTIN) 875-125 MG per tablet Take 1 tablet by mouth every 12 (twelve) hours.  14 tablet  0  . ipratropium (ATROVENT) 0.06 % nasal spray Place 2 sprays into both nostrils 4 (four) times daily.  15 mL  1  . predniSONE (DELTASONE) 10 MG tablet Take 3 tablets (30 mg total) by mouth daily.  15 tablet  0  . promethazine-codeine (PHENERGAN WITH CODEINE) 6.25-10 MG/5ML syrup Take 5 mLs by mouth at bedtime as needed for cough.  120 mL  0  . [DISCONTINUED] phentermine 37.5 MG capsule Take 18 mg by mouth daily as needed.        Exam:  BP 116/80  Pulse 74  Temp(Src) 98.2 F (36.8 C) (Oral)  Resp 16  Ht  (1.702 m)  Wt 137 lb (62.143 kg)  BMI 21.45 kg/m2  SpO2 100% Gen: Well NAD HEENT: EOMI,  MMM no pharynx with cobblestoning. Normal tympanic membranes. Clear nasal discharge present. Nontender maxillary and frontal sinuses bilaterally. Lungs: Normal work of breathing. CTABL Heart: RRR no MRG Abd: NABS, Soft. Nondistended, Nontender Exts: Brisk capillary refill, warm and well perfused.    Results for orders placed during the hospital encounter of 11/21/13 (from the past 24 hour(s))  POCT RAPID STREP A (OFFICE)     Status: None   Collection Time    11/21/13  2:41 PM      Result Value Ref Range   Rapid Strep A Screen Negative  Negative   No results found.  Assessment and Plan: 43 y.o. female with viral sinusitis and bronchitis. Treatment with prednisone Atrovent nasal spray and codeine containing cough medication. Augmentin for use patient does not improve or develops bacterial sinusitis  Discussed warning signs or symptoms. Please see discharge instructions. Patient expresses understanding.     Rodolph Bong, MD 11/21/13 1500

## 2013-11-21 NOTE — Discharge Instructions (Signed)
Thank you for coming in today. Take prednisone as directed for 5 days. Use the nasal spray and cough medicine as needed. do not take the cough medicine and drive Take Augmentin antibiotics if not getting better or worsening Call or go to the emergency room if you get worse, have trouble breathing, have chest pains, or palpitations.    Acute Bronchitis Bronchitis is inflammation of the airways that extend from the windpipe into the lungs (bronchi). The inflammation often causes mucus to develop. This leads to a cough, which is the most common symptom of bronchitis.  In acute bronchitis, the condition usually develops suddenly and goes away over time, usually in a couple weeks. Smoking, allergies, and asthma can make bronchitis worse. Repeated episodes of bronchitis may cause further lung problems.  CAUSES Acute bronchitis is most often caused by the same virus that causes a cold. The virus can spread from person to person (contagious) through coughing, sneezing, and touching contaminated objects. SIGNS AND SYMPTOMS   Cough.   Fever.   Coughing up mucus.   Body aches.   Chest congestion.   Chills.   Shortness of breath.   Sore throat.  DIAGNOSIS  Acute bronchitis is usually diagnosed through a physical exam. Your health care provider will also ask you questions about your medical history. Tests, such as chest X-rays, are sometimes done to rule out other conditions.  TREATMENT  Acute bronchitis usually goes away in a couple weeks. Oftentimes, no medical treatment is necessary. Medicines are sometimes given for relief of fever or cough. Antibiotic medicines are usually not needed but may be prescribed in certain situations. In some cases, an inhaler may be recommended to help reduce shortness of breath and control the cough. A cool mist vaporizer may also be used to help thin bronchial secretions and make it easier to clear the chest.  HOME CARE INSTRUCTIONS  Get plenty of  rest.   Drink enough fluids to keep your urine clear or pale yellow (unless you have a medical condition that requires fluid restriction). Increasing fluids may help thin your respiratory secretions (sputum) and reduce chest congestion, and it will prevent dehydration.   Take medicines only as directed by your health care provider.  If you were prescribed an antibiotic medicine, finish it all even if you start to feel better.  Avoid smoking and secondhand smoke. Exposure to cigarette smoke or irritating chemicals will make bronchitis worse. If you are a smoker, consider using nicotine gum or skin patches to help control withdrawal symptoms. Quitting smoking will help your lungs heal faster.   Reduce the chances of another bout of acute bronchitis by washing your hands frequently, avoiding people with cold symptoms, and trying not to touch your hands to your mouth, nose, or eyes.   Keep all follow-up visits as directed by your health care provider.  SEEK MEDICAL CARE IF: Your symptoms do not improve after 1 week of treatment.  SEEK IMMEDIATE MEDICAL CARE IF:  You develop an increased fever or chills.   You have chest pain.   You have severe shortness of breath.  You have bloody sputum.   You develop dehydration.  You faint or repeatedly feel like you are going to pass out.  You develop repeated vomiting.  You develop a severe headache. MAKE SURE YOU:   Understand these instructions.  Will watch your condition.  Will get help right away if you are not doing well or get worse. Document Released: 03/30/2004 Document Revised: 07/07/2013 Document  Reviewed: 08/13/2012 [employer] Patient Information 2015 [place], [place]. This information is not intended to replace advice given to you by your health care provider. Make sure you discuss any questions you have with your health care provider.  Sinusitis Sinusitis is redness, soreness, and inflammation of the paranasal sinuses.  Paranasal sinuses are air pockets within the bones of your face (beneath the eyes, the middle of the forehead, or above the eyes). In healthy paranasal sinuses, mucus is able to drain out, and air is able to circulate through them by way of your nose. However, when your paranasal sinuses are inflamed, mucus and air can become trapped. This can allow bacteria and other germs to grow and cause infection. Sinusitis can develop quickly and last only a short time (acute) or continue over a long period (chronic). Sinusitis that lasts for more than 12 weeks is considered chronic.  CAUSES  Causes of sinusitis include:  Allergies.  Structural abnormalities, such as displacement of the cartilage that separates your nostrils (deviated septum), which can decrease the air flow through your nose and sinuses and affect sinus drainage.  Functional abnormalities, such as when the small hairs (cilia) that line your sinuses and help remove mucus do not work properly or are not present. SIGNS AND SYMPTOMS  Symptoms of acute and chronic sinusitis are the same. The primary symptoms are pain and pressure around the affected sinuses. Other symptoms include:  Upper toothache.  Earache.  Headache.  Bad breath.  Decreased sense of smell and taste.  A cough, which worsens when you are lying flat.  Fatigue.  Fever.  Thick drainage from your nose, which often is green and may contain pus (purulent).  Swelling and warmth over the affected sinuses. DIAGNOSIS  Your health care provider will perform a physical exam. During the exam, your health care provider may:  Look in your nose for signs of abnormal growths in your nostrils (nasal polyps).  Tap over the affected sinus to check for signs of infection.  View the inside of your sinuses (endoscopy) using an imaging device that has a light attached (endoscope). If your health care provider suspects that you have chronic sinusitis, one or more of the following  tests may be recommended:  Allergy tests.  Nasal culture. A sample of mucus is taken from your nose, sent to a lab, and screened for bacteria.  Nasal cytology. A sample of mucus is taken from your nose and examined by your health care provider to determine if your sinusitis is related to an allergy. TREATMENT  Most cases of acute sinusitis are related to a viral infection and will resolve on their own within 10 days. Sometimes medicines are prescribed to help relieve symptoms (pain medicine, decongestants, nasal steroid sprays, or saline sprays).  However, for sinusitis related to a bacterial infection, your health care provider will prescribe antibiotic medicines. These are medicines that will help kill the bacteria causing the infection.  Rarely, sinusitis is caused by a fungal infection. In theses cases, your health care provider will prescribe antifungal medicine. For some cases of chronic sinusitis, surgery is needed. Generally, these are cases in which sinusitis recurs more than 3 times per year, despite other treatments. HOME CARE INSTRUCTIONS   Drink plenty of water. Water helps thin the mucus so your sinuses can drain more easily.  Use a humidifier.  Inhale steam 3 to 4 times a day (for example, sit in the bathroom with the shower running).  Apply a warm, moist washcloth to your  face 3 to 4 times a day, or as directed by your health care provider.  Use saline nasal sprays to help moisten and clean your sinuses.  Take medicines only as directed by your health care provider.  If you were prescribed either an antibiotic or antifungal medicine, finish it all even if you start to feel better. SEEK IMMEDIATE MEDICAL CARE IF:  You have increasing pain or severe headaches.  You have nausea, vomiting, or drowsiness.  You have swelling around your face.  You have vision problems.  You have a stiff neck.  You have difficulty breathing. MAKE SURE YOU:   Understand these  instructions.  Will watch your condition.  Will get help right away if you are not doing well or get worse. Document Released: 02/20/2005 Document Revised: 07/07/2013 Document Reviewed: 03/07/2011 [facility] Patient Information 2015 [place], [place]. This information is not intended to replace advice given to you by your health care provider. Make sure you discuss any questions you have with your health care provider.

## 2014-05-05 ENCOUNTER — Emergency Department
Admission: EM | Admit: 2014-05-05 | Discharge: 2014-05-05 | Disposition: A | Discharge status: Home / Self Care | Payer: BLUE CROSS/BLUE SHIELD | Source: Home / Self Care | Attending: Emergency Medicine | Admitting: Emergency Medicine

## 2014-05-05 ENCOUNTER — Encounter: Payer: Self-pay | Admitting: Emergency Medicine

## 2014-05-05 DIAGNOSIS — R300 Dysuria: Secondary | ICD-10-CM | POA: Diagnosis not present

## 2014-05-05 LAB — POCT URINALYSIS DIP (MANUAL ENTRY)
Bilirubin, UA: NEGATIVE
Glucose, UA: NEGATIVE
Ketones, POC UA: NEGATIVE
Nitrite, UA: NEGATIVE
Protein Ur, POC: 30
Spec Grav, UA: 1.025 (ref 1.005–1.03)
Urobilinogen, UA: 0.2 (ref 0–1)
pH, UA: 7 (ref 5–8)

## 2014-05-05 MED ORDER — CEPHALEXIN 500 MG PO CAPS: 500.0000 mg | ORAL_CAPSULE | Freq: Four times a day (QID) | ORAL | Status: DC

## 2014-05-05 NOTE — Discharge Instructions (Signed)
Dysuria  Dysuria is the medical term for pain with urination. There are many causes for dysuria, but urinary tract infection is the most common. If a urinalysis was performed it can show that there is a urinary tract infection. A urine culture confirms that you or your child is sick. You will need to follow up with a healthcare provider because:  · If a urine culture was done you will need to know the culture results and treatment recommendations.  · If the urine culture was positive, you or your child will need to be put on antibiotics or know if the antibiotics prescribed are the right antibiotics for your urinary tract infection.  · If the urine culture is negative (no urinary tract infection), then other causes may need to be explored or antibiotics need to be stopped.  Today laboratory work may have been done and there does not seem to be an infection. If cultures were done they will take at least 24 to 48 hours to be completed.  Today x-rays may have been taken and they read as normal. No cause can be found for the problems. The x-rays may be re-read by a radiologist and you will be contacted if additional findings are made.  You or your child may have been put on medications to help with this problem until you can see your primary caregiver. If the problems get better, see your primary caregiver if the problems return. If you were given antibiotics (medications which kill germs), take all of the mediations as directed for the full course of treatment.   If laboratory work was done, you need to find the results. Leave a telephone number where you can be reached. If this is not possible, make sure you find out how you are to get test results.  HOME CARE INSTRUCTIONS   · Drink lots of fluids. For adults, drink eight, 8 ounce glasses of clear juice or water a day. For children, replace fluids as suggested by your caregiver.  · Empty the bladder often. Avoid holding urine for long periods of time.  · After a bowel  movement, women should cleanse front to back, using each tissue only once.  · Empty your bladder before and after sexual intercourse.  · Take all the medicine given to you until it is gone. You may feel better in a few days, but TAKE ALL MEDICINE.  · Avoid caffeine, tea, alcohol and carbonated beverages, because they tend to irritate the bladder.  · In men, alcohol may irritate the prostate.  · Only take over-the-counter or prescription medicines for pain, discomfort, or fever as directed by your caregiver.  · If your caregiver has given you a follow-up appointment, it is very important to keep that appointment. Not keeping the appointment could result in a chronic or permanent injury, pain, and disability. If there is any problem keeping the appointment, you must call back to this facility for assistance.  SEEK IMMEDIATE MEDICAL CARE IF:   · Back pain develops.  · A fever develops.  · There is nausea (feeling sick to your stomach) or vomiting (throwing up).  · Problems are no better with medications or are getting worse.  MAKE SURE YOU:   · Understand these instructions.  · Will watch your condition.  · Will get help right away if you are not doing well or get worse.  Document Released: 11/19/2003 Document Revised: 05/15/2011 Document Reviewed: 09/26/2007  ExitCare® Patient Information ©2015 [employer]. This information is not   intended to replace advice given to you by your health care provider. Make sure you discuss any questions you have with your health care provider.  Urinary Tract Infection  Urinary tract infections (UTIs) can develop anywhere along your urinary tract. Your urinary tract is your body's drainage system for removing wastes and extra water. Your urinary tract includes two kidneys, two ureters, a bladder, and a urethra. Your kidneys are a pair of bean-shaped organs. Each kidney is about the size of your fist. They are located below your ribs, one on each side of your spine.  CAUSES  Infections  are caused by microbes, which are microscopic organisms, including fungi, viruses, and bacteria. These organisms are so small that they can only be seen through a microscope. Bacteria are the microbes that most commonly cause UTIs.  SYMPTOMS   Symptoms of UTIs may vary by age and gender of the patient and by the location of the infection. Symptoms in young women typically include a frequent and intense urge to urinate and a painful, burning feeling in the bladder or urethra during urination. Older women and men are more likely to be tired, shaky, and weak and have muscle aches and abdominal pain. A fever may mean the infection is in your kidneys. Other symptoms of a kidney infection include pain in your back or sides below the ribs, nausea, and vomiting.  DIAGNOSIS  To diagnose a UTI, your caregiver will ask you about your symptoms. Your caregiver also will ask to provide a urine sample. The urine sample will be tested for bacteria and white blood cells. White blood cells are made by your body to help fight infection.  TREATMENT   Typically, UTIs can be treated with medication. Because most UTIs are caused by a bacterial infection, they usually can be treated with the use of antibiotics. The choice of antibiotic and length of treatment depend on your symptoms and the type of bacteria causing your infection.  HOME CARE INSTRUCTIONS  · If you were prescribed antibiotics, take them exactly as your caregiver instructs you. Finish the medication even if you feel better after you have only taken some of the medication.  · Drink enough water and fluids to keep your urine clear or pale yellow.  · Avoid caffeine, tea, and carbonated beverages. They tend to irritate your bladder.  · Empty your bladder often. Avoid holding urine for long periods of time.  · Empty your bladder before and after sexual intercourse.  · After a bowel movement, women should cleanse from front to back. Use each tissue only once.  SEEK MEDICAL CARE  IF:   · You have back pain.  · You develop a fever.  · Your symptoms do not begin to resolve within 3 days.  SEEK IMMEDIATE MEDICAL CARE IF:   · You have severe back pain or lower abdominal pain.  · You develop chills.  · You have nausea or vomiting.  · You have continued burning or discomfort with urination.  MAKE SURE YOU:   · Understand these instructions.  · Will watch your condition.  · Will get help right away if you are not doing well or get worse.  Document Released: 11/30/2004 Document Revised: 08/22/2011 Document Reviewed: 03/31/2011  ExitCare® Patient Information ©2015 [employer]. This information is not intended to replace advice given to you by your health care provider. Make sure you discuss any questions you have with your health care provider.

## 2014-05-05 NOTE — ED Provider Notes (Signed)
CSN: [id number]     Arrival date & time 05/05/14  [id number] History   First MD Initiated Contact with Patient 05/05/14 0845     Chief Complaint  Patient presents with  . Dysuria   (Consider location/radiation/quality/duration/timing/severity/associated sxs/prior Treatment) Patient is a 44 y.o. female presenting with dysuria. The history is provided by the patient. No language interpreter was used.  Dysuria Pain quality:  Aching Pain severity:  Moderate Onset quality:  Gradual Duration:  1 day Timing:  Constant Progression:  Worsening Chronicity:  New Recent urinary tract infections: no   Relieved by:  Nothing Worsened by:  Nothing tried Ineffective treatments:  None tried Urinary symptoms: discolored urine and frequent urination   Associated symptoms: no abdominal pain   Risk factors: no hx of pyelonephritis     History reviewed. No pertinent past medical history. Past Surgical History  Procedure Laterality Date  . Tonsillectomy    . Intrauterine device (iud) insertion  2013   Family History  Problem Relation Age of Onset  . COPD Father    History  Substance Use Topics  . Smoking status: Former Games developermoker  . Smokeless tobacco: Not on file  . Alcohol Use: 1.5 oz/week    3 drink(s) per week   OB History    No data available     Review of Systems  Gastrointestinal: Negative for abdominal pain.  Genitourinary: Positive for dysuria.  All other systems reviewed and are negative.   Allergies  Nitrofuran derivatives and Tetracyclines & related  Home Medications   Prior to Admission medications   Medication Sig Start Date End Date Taking? Authorizing Provider  amoxicillin-clavulanate (AUGMENTIN) 875-125 MG per tablet Take 1 tablet by mouth every 12 (twelve) hours. 11/21/13   Rodolph BongEvan S Corey, MD  cephALEXin (KEFLEX) 500 MG capsule Take 1 capsule (500 mg total) by mouth 4 (four) times daily. 05/05/14   Elson AreasLeslie K Nehemyah Foushee, PA-C  cetirizine (ZYRTEC) 10 MG tablet Take 10 mg by mouth  daily.    Historical Provider, MD  ipratropium (ATROVENT) 0.06 % nasal spray Place 2 sprays into both nostrils 4 (four) times daily. 11/21/13   Rodolph BongEvan S Corey, MD  Phenyleph-CPM-DM-Aspirin (ALKA-SELTZER PLUS COLD & COUGH PO) Take by mouth.    Historical Provider, MD  predniSONE (DELTASONE) 10 MG tablet Take 3 tablets (30 mg total) by mouth daily. 11/21/13   Rodolph BongEvan S Corey, MD  promethazine-codeine (PHENERGAN WITH CODEINE) 6.25-10 MG/5ML syrup Take 5 mLs by mouth at bedtime as needed for cough. 11/21/13   Rodolph BongEvan S Corey, MD   BP 118/80 mmHg  Pulse 71  Temp(Src) 97.7 F (36.5 C) (Oral)  Ht 5\' 7"  (1.702 m)  Wt 139 lb (63.05 kg)  BMI 21.77 kg/m2  SpO2 100% Physical Exam  Constitutional: She is oriented to person, place, and time. She appears well-developed and well-nourished.  HENT:  Head: Normocephalic and atraumatic.  Eyes: EOM are normal.  Cardiovascular: Normal rate.   Pulmonary/Chest: Effort normal.  Abdominal: She exhibits no distension.  Musculoskeletal: Normal range of motion.  Neurological: She is alert and oriented to person, place, and time.  Skin: Skin is warm.  Psychiatric: She has a normal mood and affect.  Nursing note and vitals reviewed.   ED Course  Procedures (including critical care time) Labs Review Labs Reviewed  URINE CULTURE  POCT URINALYSIS DIP (MANUAL ENTRY)    Imaging Review No results found.   MDM   1. Dysuria    Keflex 500mg  qid   Elson AreasLeslie K Kathrina Crosley,  PA-C 05/05/14 1610

## 2014-05-05 NOTE — ED Notes (Signed)
Dysuria started this morning 

## 2014-05-07 ENCOUNTER — Telehealth: Payer: Self-pay | Admitting: Emergency Medicine

## 2014-05-07 LAB — URINE CULTURE
Colony Count: NO GROWTH
Organism ID, Bacteria: NO GROWTH

## 2014-08-27 IMAGING — US US SOFT TISSUE HEAD/NECK
1 series · 13 of 13 positions shown · non-contrast
Comparison: None

CLINICAL DATA: Palpable area posterior to the ear on the left

ULTRASOUND OF HEAD/NECK SOFT TISSUES
TECHNIQUE: Ultrasound examination of the head and neck soft
tissues was performed in the area of clinical concern.

[Series 1: us soft tissue head/neck · 0.06mm/px · 13 of 13 slices shown]
[im 1/13]
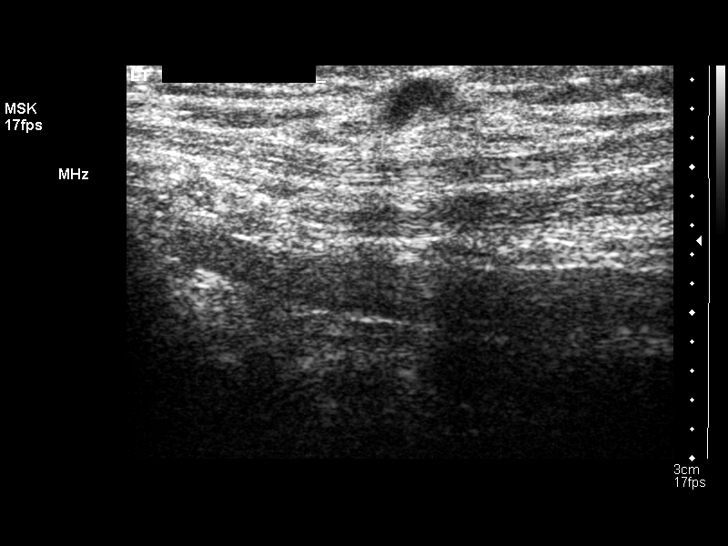
[im 2/13]
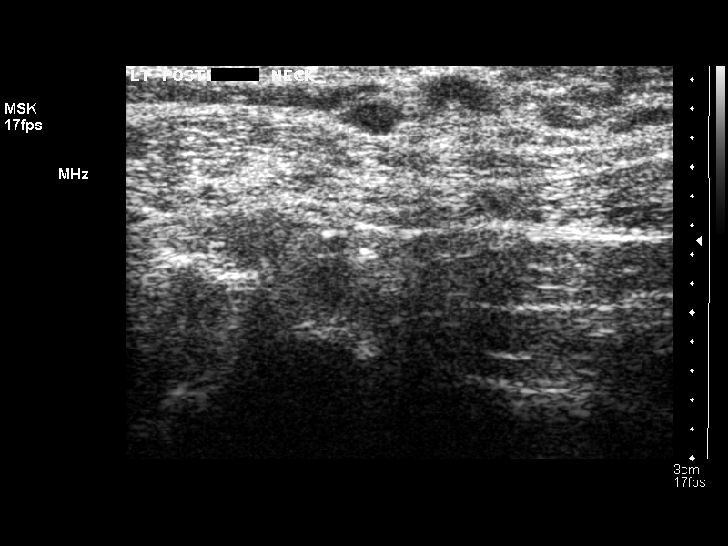
[im 3/13]
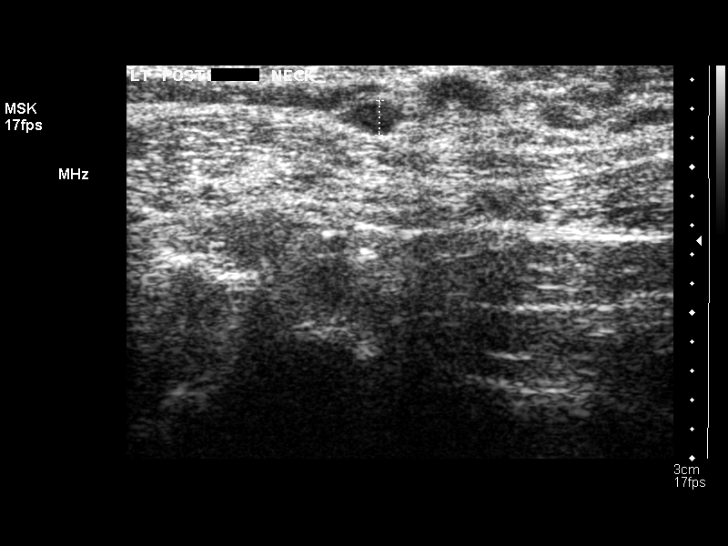
[im 4/13]
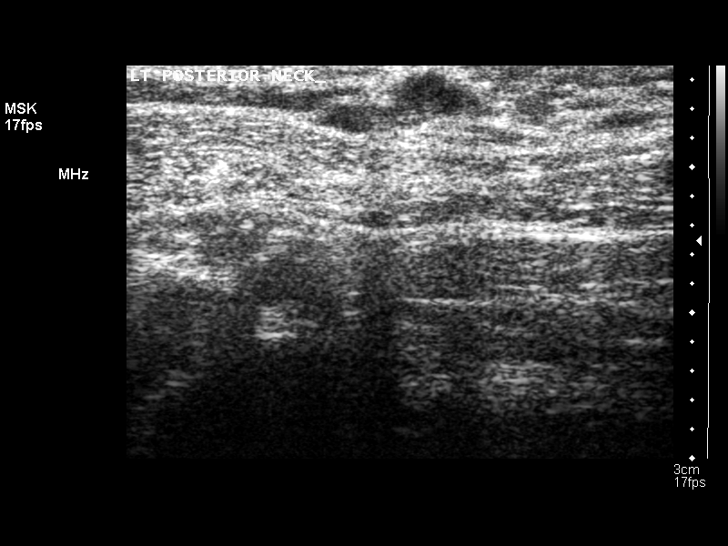
[im 5/13]
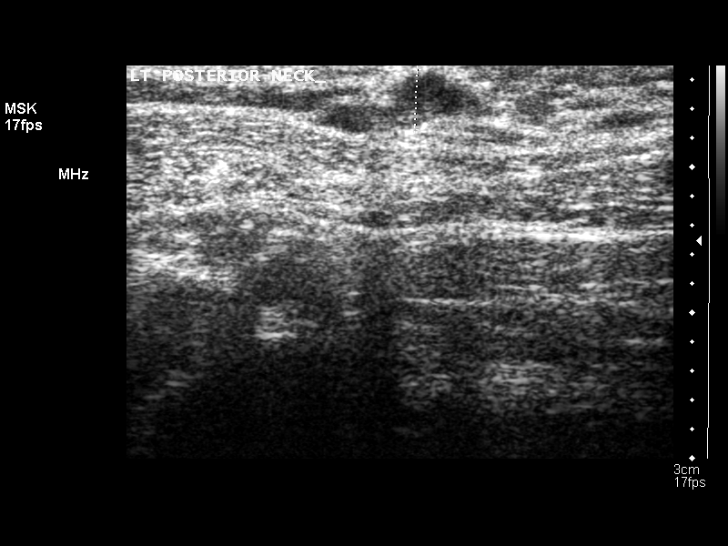
[im 6/13]
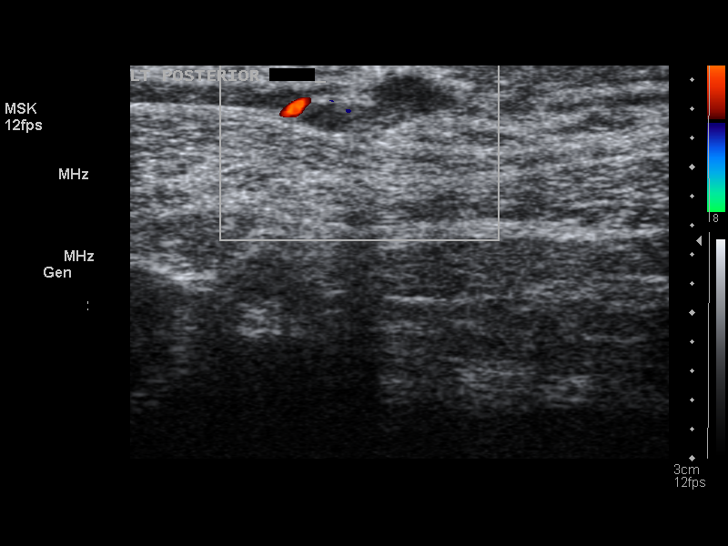
[im 7/13]
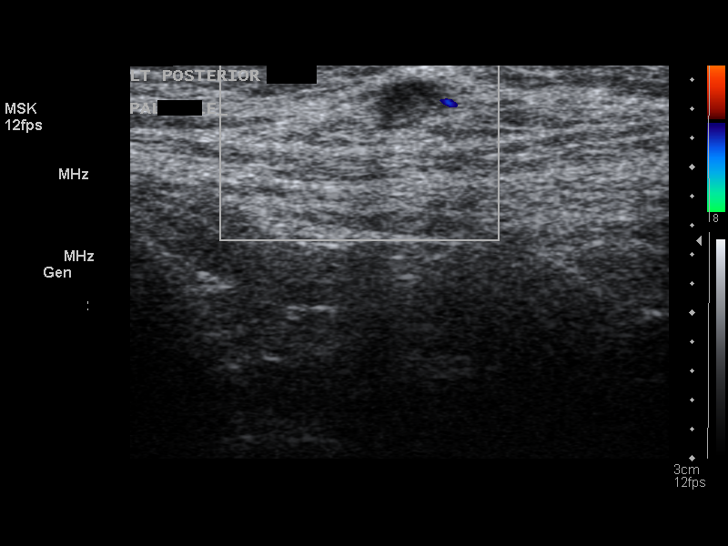
[im 8/13]
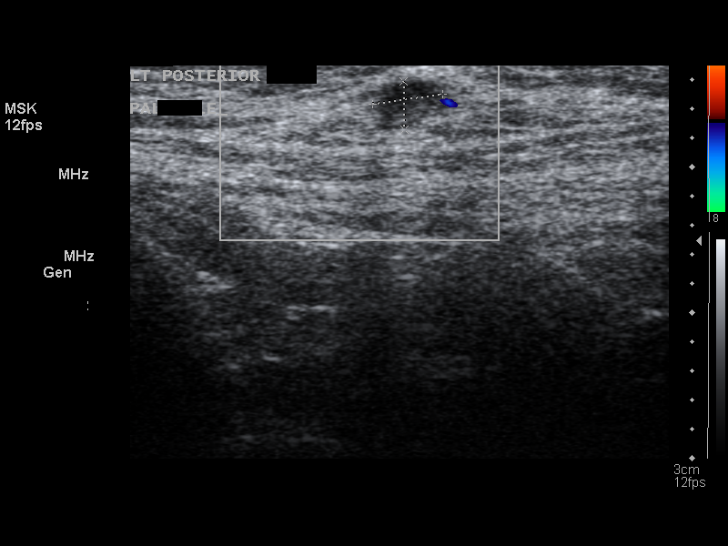
[im 9/13]
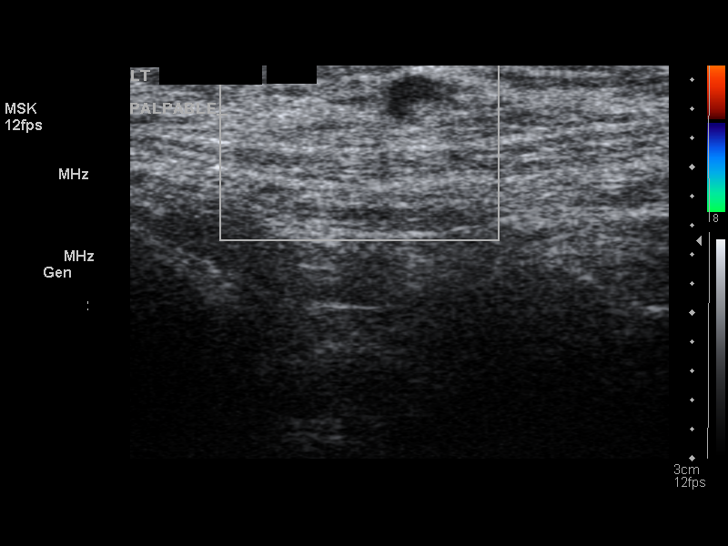
[im 10/13]
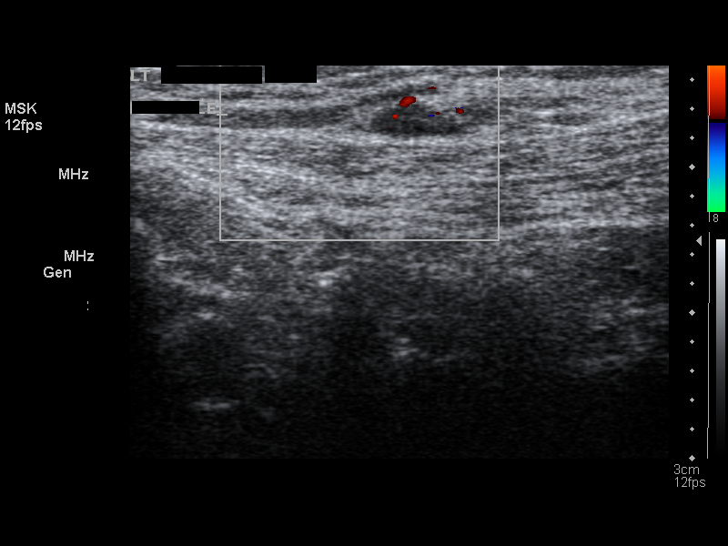
[im 11/13]
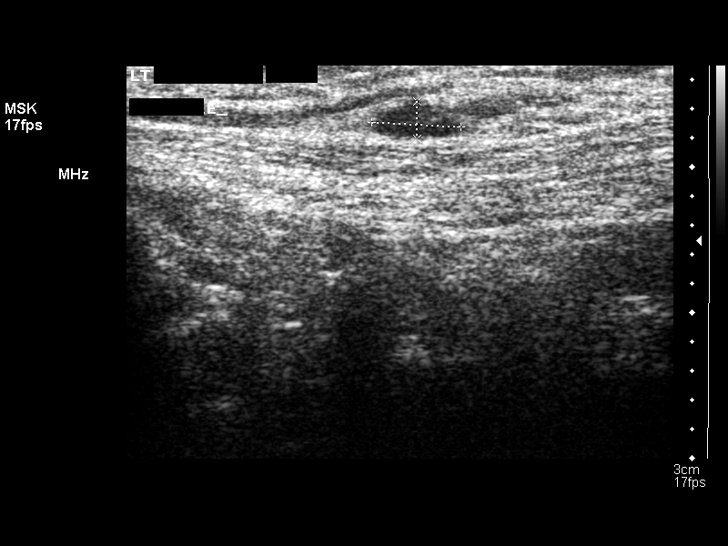
[im 12/13]
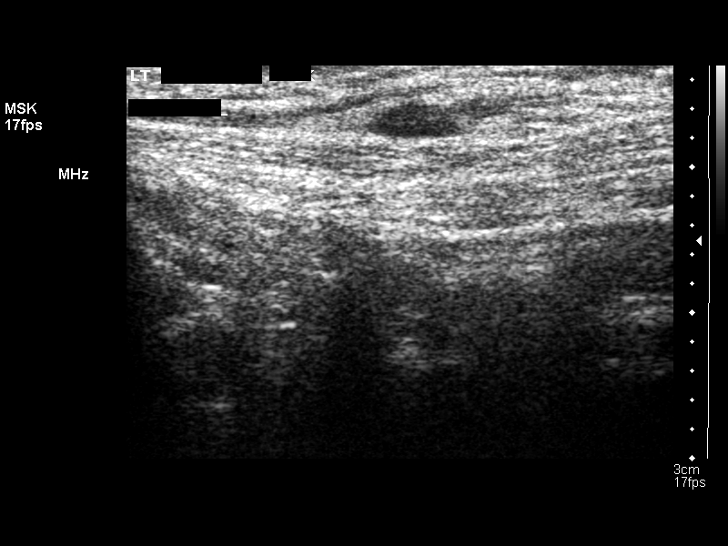
[im 13/13]
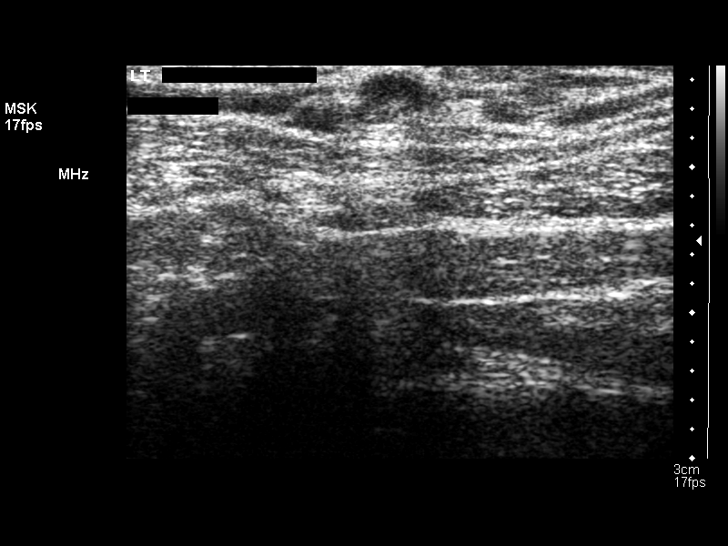

[13 of 13 positions shown; findings below may reference images not displayed]

FINDINGS: Ultrasound over the area in question was performed.
There are two small hypoechoic structures of no more than 4 mm in
diameter corresponding to this area, most consistent with small
lymph nodes.  No abnormality of these lymph nodes is seen by
ultrasound.
IMPRESSION: The area in question appears to represent two small adjacent lymph
nodes of normal size.

## 2014-10-19 ENCOUNTER — Encounter: Payer: Self-pay | Admitting: Family Medicine

## 2014-10-19 ENCOUNTER — Ambulatory Visit (INDEPENDENT_AMBULATORY_CARE_PROVIDER_SITE_OTHER): Payer: BLUE CROSS/BLUE SHIELD | Admitting: Family Medicine

## 2014-10-19 VITALS — BP 121/82 | HR 72 | Wt 147.0 lb

## 2014-10-19 DIAGNOSIS — R591 Generalized enlarged lymph nodes: Secondary | ICD-10-CM | POA: Diagnosis not present

## 2014-10-19 MED ORDER — VALACYCLOVIR HCL 1 G PO TABS: 1000.0000 mg | ORAL_TABLET | Freq: Three times a day (TID) | ORAL | Status: DC

## 2014-10-19 MED ORDER — TRAMADOL HCL 50 MG PO TABS: 50.0000 mg | ORAL_TABLET | Freq: Three times a day (TID) | ORAL | Status: DC | PRN

## 2014-10-19 MED ORDER — VALACYCLOVIR HCL 1 G PO TABS: 1000.0000 mg | ORAL_TABLET | Freq: Three times a day (TID) | ORAL | Status: AC

## 2014-10-19 MED ORDER — AMOXICILLIN-POT CLAVULANATE 875-125 MG PO TABS: 1.0000 | ORAL_TABLET | Freq: Two times a day (BID) | ORAL | Status: DC

## 2014-10-19 NOTE — Patient Instructions (Signed)
Thank you for coming in today. Take 2 aleve twice daily.  Take tramadol for pain as needed. Call or go to the emergency room if you get worse, have trouble breathing, have chest pains, or palpitations.   Shingles Shingles (herpes zoster) is an infection that is caused by the same virus that causes chickenpox (varicella). The infection causes a painful skin rash and fluid-filled blisters, which eventually break open, crust over, and heal. It may occur in any area of the body, but it usually affects only one side of the body or face. The pain of shingles usually lasts about 1 month. However, some people with shingles may develop long-term (chronic) pain in the affected area of the body. Shingles often occurs many years after the person had chickenpox. It is more common:  In people older than 50 years.  In people with weakened immune systems, such as those with HIV, AIDS, or cancer.  In people taking medicines that weaken the immune system, such as transplant medicines.  In people under great stress. CAUSES  Shingles is caused by the varicella zoster virus (VZV), which also causes chickenpox. After a person is infected with the virus, it can remain in the person's body for years in an inactive state (dormant). To cause shingles, the virus reactivates and breaks out as an infection in a nerve root. The virus can be spread from person to person (contagious) through contact with open blisters of the shingles rash. It will only spread to people who have not had chickenpox. When these people are exposed to the virus, they may develop chickenpox. They will not develop shingles. Once the blisters scab over, the person is no longer contagious and cannot spread the virus to others. SIGNS AND SYMPTOMS  Shingles shows up in stages. The initial symptoms may be pain, itching, and tingling in an area of the skin. This pain is usually described as burning, stabbing, or throbbing.In a few days or weeks, a painful  red rash will appear in the area where the pain, itching, and tingling were felt. The rash is usually on one side of the body in a band or belt-like pattern. Then, the rash usually turns into fluid-filled blisters. They will scab over and dry up in approximately 2-3 weeks. Flu-like symptoms may also occur with the initial symptoms, the rash, or the blisters. These may include:  Fever.  Chills.  Headache.  Upset stomach. DIAGNOSIS  Your health care provider will perform a skin exam to diagnose shingles. Skin scrapings or fluid samples may also be taken from the blisters. This sample will be examined under a microscope or sent to a lab for further testing. TREATMENT  There is no specific cure for shingles. Your health care provider will likely prescribe medicines to help you manage the pain, recover faster, and avoid long-term problems. This may include antiviral drugs, anti-inflammatory drugs, and pain medicines. HOME CARE INSTRUCTIONS   Take a cool bath or apply cool compresses to the area of the rash or blisters as directed. This may help with the pain and itching.   Take medicines only as directed by your health care provider.   Rest as directed by your health care provider.  Keep your rash and blisters clean with mild soap and cool water or as directed by your health care provider.  Do not pick your blisters or scratch your rash. Apply an anti-itch cream or numbing creams to the affected area as directed by your health care provider.  Keep your  shingles rash covered with a loose bandage (dressing).  Avoid skin contact with:  Babies.   Pregnant women.   Children with eczema.   Elderly people with transplants.   People with chronic illnesses, such as leukemia or AIDS.   Wear loose-fitting clothing to help ease the pain of material rubbing against the rash.  Keep all follow-up visits as directed by your health care provider.If the area involved is on your face, you  may receive a referral for a specialist, such as an eye doctor (ophthalmologist) or an ear, nose, and throat (ENT) doctor. Keeping all follow-up visits will help you avoid eye problems, chronic pain, or disability.  SEEK IMMEDIATE MEDICAL CARE IF:   You have facial pain, pain around the eye area, or loss of feeling on one side of your face.  You have ear pain or ringing in your ear.  You have loss of taste.  Your pain is not relieved with prescribed medicines.   Your redness or swelling spreads.   You have more pain and swelling.  Your condition is worsening or has changed.   You have a fever. MAKE SURE YOU:  Understand these instructions.  Will watch your condition.  Will get help right away if you are not doing well or get worse. Document Released: 02/20/2005 Document Revised: 07/07/2013 Document Reviewed: 10/05/2011 [facility] Patient Information 2015 [place], [place]. This information is not intended to replace advice given to you by your health care provider. Make sure you discuss any questions you have with your health care provider.   Lymphadenopathy Lymphadenopathy means "disease of the lymph glands." But the term is usually used to describe swollen or enlarged lymph glands, also called lymph nodes. These are the bean-shaped organs found in many locations including the neck, underarm, and groin. Lymph glands are part of the immune system, which fights infections in your body. Lymphadenopathy can occur in just one area of the body, such as the neck, or it can be generalized, with lymph node enlargement in several areas. The nodes found in the neck are the most common sites of lymphadenopathy. CAUSES When your immune system responds to germs (such as viruses or bacteria ), infection-fighting cells and fluid build up. This causes the glands to grow in size. Usually, this is not something to worry about. Sometimes, the glands themselves can become infected and inflamed. This is  called lymphadenitis. Enlarged lymph nodes can be caused by many diseases:  Bacterial disease, such as strep throat or a skin infection.  Viral disease, such as a common cold.  Other germs, such as Lyme disease, tuberculosis, or sexually transmitted diseases.  Cancers, such as lymphoma (cancer of the lymphatic system) or leukemia (cancer of the white blood cells).  Inflammatory diseases such as lupus or rheumatoid arthritis.  Reactions to medications. Many of the diseases above are rare, but important. This is why you should see your caregiver if you have lymphadenopathy. SYMPTOMS  Swollen, enlarged lumps in the neck, back of the head, or other locations.  Tenderness.  Warmth or redness of the skin over the lymph nodes.  Fever. DIAGNOSIS Enlarged lymph nodes are often near the source of infection. They can help health care providers diagnose your illness. For instance:  Swollen lymph nodes around the jaw might be caused by an infection in the mouth.  Enlarged glands in the neck often signal a throat infection.  Lymph nodes that are swollen in more than one area often indicate an illness caused by a virus.  Your caregiver will likely know what is causing your lymphadenopathy after listening to your history and examining you. Blood tests, x-rays, or other tests may be needed. If the cause of the enlarged lymph node cannot be found, and it does not go away by itself, then a biopsy may be needed. Your caregiver will discuss this with you. TREATMENT Treatment for your enlarged lymph nodes will depend on the cause. Many times the nodes will shrink to normal size by themselves, with no treatment. Antibiotics or other medicines may be needed for infection. Only take over-the-counter or prescription medicines for pain, discomfort, or fever as directed by your caregiver. HOME CARE INSTRUCTIONS Swollen lymph glands usually return to normal when the underlying medical condition goes away. If  they persist, contact your health-care provider. He/she might prescribe antibiotics or other treatments, depending on the diagnosis. Take any medications exactly as prescribed. Keep any follow-up appointments made to check on the condition of your enlarged nodes. SEEK MEDICAL CARE IF:  Swelling lasts for more than two weeks.  You have symptoms such as weight loss, night sweats, fatigue, or fever that does not go away.  The lymph nodes are hard, seem fixed to the skin, or are growing rapidly.  Skin over the lymph nodes is red and inflamed. This could mean there is an infection. SEEK IMMEDIATE MEDICAL CARE IF:  Fluid starts leaking from the area of the enlarged lymph node.  You develop a fever of 102 F (38.9 C) or greater.  Severe pain develops (not necessarily at the site of a large lymph node).  You develop chest pain or shortness of breath.  You develop worsening abdominal pain. MAKE SURE YOU:  Understand these instructions.  Will watch your condition.  Will get help right away if you are not doing well or get worse. Document Released: 11/30/2007 Document Revised: 07/07/2013 Document Reviewed: 11/30/2007 [facility] Patient Information 2015 [place], [place]. This information is not intended to replace advice given to you by your health care provider. Make sure you discuss any questions you have with your health care provider.

## 2014-10-19 NOTE — Assessment & Plan Note (Signed)
Left neck pain and scalp pain. Patient clearly has lymphadenopathy. This may be bacterial lymphadenitis or early shingles. Additionally temporal arterial this is a possibility. Obtain CBC, CMP, sedimentation rate, CRP. Treat empirically with Valtrex and Augmentin. Additionally use naproxen and tramadol for pain control. Return if not better.

## 2014-10-19 NOTE — Progress Notes (Signed)
Gail Lindsey is a 44 y.o. female who presents to [facility]  today for neck pain and headache. Patient is a three-day history of fatigue and significant left-sided neck pain radiating to her scalp. No weakness or numbness. She has tender nodules along her neck and scalp. She has tried Advil which helps. No vomiting diarrhea chest pains or palpitations. She denies any jaw pain with chewing. Symptoms started when she was at the beach.   No past medical history on file. Past Surgical History  Procedure Laterality Date  . Tonsillectomy    . Intrauterine device (iud) insertion  2013   Social History  Substance Use Topics  . Smoking status: Former Games developer  . Smokeless tobacco: Not on file  . Alcohol Use: 1.5 oz/week    3 drink(s) per week   ROS as above Medications: Current Outpatient Prescriptions  Medication Sig Dispense Refill  . amoxicillin-clavulanate (AUGMENTIN) 875-125 MG per tablet Take 1 tablet by mouth every 12 (twelve) hours. 14 tablet 0  . traMADol (ULTRAM) 50 MG tablet Take 1 tablet (50 mg total) by mouth every 8 (eight) hours as needed. 30 tablet 0  . valACYclovir (VALTREX) 1000 MG tablet Take 1 tablet (1,000 mg total) by mouth 3 (three) times daily. 21 tablet 0  . [DISCONTINUED] phentermine 37.5 MG capsule Take 18 mg by mouth daily as needed.     No current facility-administered medications for this visit.   Allergies  Allergen Reactions  . Nitrofuran Derivatives   . Tetracyclines & Related      Exam:  BP 121/82 mmHg  Pulse 72  Wt 147 lb (66.679 kg) Gen: Well NAD HEENT: EOMI,  MMM normal tympanic membranes posterior pharynx. Tender palpable anterior and posterior auricular or cervical lymph nodes as well as posterior scalp once meds. Small papule present at the lateral scalp. She is tender palpation at the left temporal region as well Lungs: Normal work of breathing. CTABL Heart: RRR no MRG Abd: NABS, Soft. Nondistended,  Nontender Exts: Brisk capillary refill, warm and well perfused.   No results found for this or any previous visit (from the past 24 hour(s)). No results found.   Please see individual assessment and plan sections.

## 2014-10-20 ENCOUNTER — Ambulatory Visit: Payer: BLUE CROSS/BLUE SHIELD | Admitting: Family Medicine

## 2014-10-20 LAB — COMPLETE METABOLIC PANEL WITH GFR
ALBUMIN: 3.9 g/dL (ref 3.6–5.1)
ALK PHOS: 40 U/L (ref 33–115)
ALT: 12 U/L (ref 6–29)
AST: 17 U/L (ref 10–35)
BUN: 8 mg/dL (ref 7–25)
CO2: 25 mmol/L (ref 20–31)
Calcium: 9.3 mg/dL (ref 8.6–10.2)
Chloride: 99 mmol/L (ref 98–110)
Creat: 0.73 mg/dL (ref 0.50–1.10)
Glucose, Bld: 86 mg/dL (ref 65–99)
Potassium: 3.8 mmol/L (ref 3.5–5.3)
Sodium: 138 mmol/L (ref 135–146)
TOTAL PROTEIN: 6.5 g/dL (ref 6.1–8.1)
Total Bilirubin: 0.4 mg/dL (ref 0.2–1.2)

## 2014-10-20 LAB — CBC
HEMATOCRIT: 39.4 % (ref 36.0–46.0)
Hemoglobin: 13.2 g/dL (ref 12.0–15.0)
MCH: 30.5 pg (ref 26.0–34.0)
MCHC: 33.5 g/dL (ref 30.0–36.0)
MCV: 91 fL (ref 78.0–100.0)
MPV: 9.1 fL (ref 8.6–12.4)
Platelets: 261 10*3/uL (ref 150–400)
RBC: 4.33 MIL/uL (ref 3.87–5.11)
RDW: 13.7 % (ref 11.5–15.5)
WBC: 6.6 10*3/uL (ref 4.0–10.5)

## 2014-10-20 LAB — SEDIMENTATION RATE: Sed Rate: 4 mm/hr (ref 0–20)

## 2014-10-20 LAB — C-REACTIVE PROTEIN: CRP: <0.5 mg/dL (ref <0.60)

## 2014-10-20 NOTE — Progress Notes (Signed)
Quick Note:  Normal, no changes. ______ 

## 2014-10-21 ENCOUNTER — Ambulatory Visit (INDEPENDENT_AMBULATORY_CARE_PROVIDER_SITE_OTHER): Payer: BLUE CROSS/BLUE SHIELD | Admitting: Family Medicine

## 2014-10-21 VITALS — BP 101/66 | HR 78 | Temp 98.0°F | Wt 146.0 lb

## 2014-10-21 DIAGNOSIS — B029 Zoster without complications: Secondary | ICD-10-CM | POA: Insufficient documentation

## 2014-10-21 MED ORDER — OXYCODONE-ACETAMINOPHEN 5-325 MG PO TABS: 1.0000 | ORAL_TABLET | Freq: Four times a day (QID) | ORAL | Status: DC | PRN

## 2014-10-21 MED ORDER — PREDNISONE 10 MG (21) PO TBPK: ORAL_TABLET | ORAL | Status: DC

## 2014-10-21 NOTE — Assessment & Plan Note (Signed)
I suspect patient's symptoms are most likely due to shingles. Continue Valtrex start prednisone. Oxycodone for pain as tramadol is not sufficient.

## 2014-10-21 NOTE — Patient Instructions (Signed)
Thank you for coming in today. Return in a few days if not improving. Take oxycodone for pain as needed  Shingles Shingles (herpes zoster) is an infection that is caused by the same virus that causes chickenpox (varicella). The infection causes a painful skin rash and fluid-filled blisters, which eventually break open, crust over, and heal. It may occur in any area of the body, but it usually affects only one side of the body or face. The pain of shingles usually lasts about 1 month. However, some people with shingles may develop long-term (chronic) pain in the affected area of the body. Shingles often occurs many years after the person had chickenpox. It is more common:  In people older than 50 years.  In people with weakened immune systems, such as those with HIV, AIDS, or cancer.  In people taking medicines that weaken the immune system, such as transplant medicines.  In people under great stress. CAUSES  Shingles is caused by the varicella zoster virus (VZV), which also causes chickenpox. After a person is infected with the virus, it can remain in the person's body for years in an inactive state (dormant). To cause shingles, the virus reactivates and breaks out as an infection in a nerve root. The virus can be spread from person to person (contagious) through contact with open blisters of the shingles rash. It will only spread to people who have not had chickenpox. When these people are exposed to the virus, they may develop chickenpox. They will not develop shingles. Once the blisters scab over, the person is no longer contagious and cannot spread the virus to others. SIGNS AND SYMPTOMS  Shingles shows up in stages. The initial symptoms may be pain, itching, and tingling in an area of the skin. This pain is usually described as burning, stabbing, or throbbing.In a few days or weeks, a painful red rash will appear in the area where the pain, itching, and tingling were felt. The rash is  usually on one side of the body in a band or belt-like pattern. Then, the rash usually turns into fluid-filled blisters. They will scab over and dry up in approximately 2-3 weeks. Flu-like symptoms may also occur with the initial symptoms, the rash, or the blisters. These may include:  Fever.  Chills.  Headache.  Upset stomach. DIAGNOSIS  Your health care provider will perform a skin exam to diagnose shingles. Skin scrapings or fluid samples may also be taken from the blisters. This sample will be examined under a microscope or sent to a lab for further testing. TREATMENT  There is no specific cure for shingles. Your health care provider will likely prescribe medicines to help you manage the pain, recover faster, and avoid long-term problems. This may include antiviral drugs, anti-inflammatory drugs, and pain medicines. HOME CARE INSTRUCTIONS   Take a cool bath or apply cool compresses to the area of the rash or blisters as directed. This may help with the pain and itching.   Take medicines only as directed by your health care provider.   Rest as directed by your health care provider.  Keep your rash and blisters clean with mild soap and cool water or as directed by your health care provider.  Do not pick your blisters or scratch your rash. Apply an anti-itch cream or numbing creams to the affected area as directed by your health care provider.  Keep your shingles rash covered with a loose bandage (dressing).  Avoid skin contact with:  Babies.   Pregnant  women.   Children with eczema.   Elderly people with transplants.   People with chronic illnesses, such as leukemia or AIDS.   Wear loose-fitting clothing to help ease the pain of material rubbing against the rash.  Keep all follow-up visits as directed by your health care provider.If the area involved is on your face, you may receive a referral for a specialist, such as an eye doctor (ophthalmologist) or an ear,  nose, and throat (ENT) doctor. Keeping all follow-up visits will help you avoid eye problems, chronic pain, or disability.  SEEK IMMEDIATE MEDICAL CARE IF:   You have facial pain, pain around the eye area, or loss of feeling on one side of your face.  You have ear pain or ringing in your ear.  You have loss of taste.  Your pain is not relieved with prescribed medicines.   Your redness or swelling spreads.   You have more pain and swelling.  Your condition is worsening or has changed.   You have a fever. MAKE SURE YOU:  Understand these instructions.  Will watch your condition.  Will get help right away if you are not doing well or get worse. Document Released: 02/20/2005 Document Revised: 07/07/2013 Document Reviewed: 10/05/2011 [facility] Patient Information 2015 [place], [place]. This information is not intended to replace advice given to you by your health care provider. Make sure you discuss any questions you have with your health care provider.

## 2014-10-21 NOTE — Progress Notes (Signed)
Gail Lindsey is a 44 y.o. female who presents to [facility]  today for follow-up left scalp pain. Patient was seen 2 days ago with a swollen lymph nodes and irritated scalp. She was thought to have possibly shingles. She was started on Augmentin and also on Valtrex. She notes her symptoms have worsened. She has burning pain along the posterior left scalp and neck. The rash has spread also. No fevers chills nausea vomiting or diarrhea. Tramadol was not helpful.   No past medical history on file. Past Surgical History  Procedure Laterality Date  . Tonsillectomy    . Intrauterine device (iud) insertion  2013   Social History  Substance Use Topics  . Smoking status: Former Games developer  . Smokeless tobacco: Not on file  . Alcohol Use: 1.5 oz/week    3 drink(s) per week   ROS as above Medications: Current Outpatient Prescriptions  Medication Sig Dispense Refill  . amoxicillin-clavulanate (AUGMENTIN) 875-125 MG per tablet Take 1 tablet by mouth every 12 (twelve) hours. 14 tablet 0  . oxyCODONE-acetaminophen (ROXICET) 5-325 MG per tablet Take 1 tablet by mouth every 6 (six) hours as needed for severe pain. 30 tablet 0  . predniSONE (STERAPRED UNI-PAK 21 TAB) 10 MG (21) TBPK tablet 6 day dosepack po 21 tablet 0  . traMADol (ULTRAM) 50 MG tablet Take 1 tablet (50 mg total) by mouth every 8 (eight) hours as needed. 30 tablet 0  . valACYclovir (VALTREX) 1000 MG tablet Take 1 tablet (1,000 mg total) by mouth 3 (three) times daily. 21 tablet 0  . [DISCONTINUED] phentermine 37.5 MG capsule Take 18 mg by mouth daily as needed.     No current facility-administered medications for this visit.   Allergies  Allergen Reactions  . Nitrofuran Derivatives   . Tetracyclines & Related      Exam:  BP 101/66 mmHg  Pulse 78  Temp(Src) 98 F (36.7 C)  Wt 146 lb (66.225 kg) Gen: Well NAD HEENT: EOMI,  MMM  Lungs: Normal work of breathing. CTABL Heart: RRR no MRG Abd:  NABS, Soft. Nondistended, Nontender Exts: Brisk capillary refill, warm and well perfused.  Skin: Erythematous vesicular lesions in a dermatomal pattern present left scalp. Skin is tender.  No results found for this or any previous visit (from the past 24 hour(s)). No results found.   Please see individual assessment and plan sections.

## 2014-10-26 LAB — REFLEX ADENOVIRUS CULTURE

## 2014-10-26 LAB — RFX HSV/VARICELLA ZOSTER RAPID CULT

## 2014-10-26 NOTE — Progress Notes (Signed)
Quick Note:  Shingles virus was not detected. This does not mean that Gail Lindsey does not have the virus. How is she doing? ______

## 2014-10-27 ENCOUNTER — Ambulatory Visit (INDEPENDENT_AMBULATORY_CARE_PROVIDER_SITE_OTHER): Payer: BLUE CROSS/BLUE SHIELD | Admitting: Family Medicine

## 2014-10-27 VITALS — BP 117/78 | HR 86 | Ht 67.0 in | Wt 148.0 lb

## 2014-10-27 DIAGNOSIS — B029 Zoster without complications: Secondary | ICD-10-CM

## 2014-10-27 MED ORDER — GABAPENTIN 300 MG PO CAPS: 300.0000 mg | ORAL_CAPSULE | Freq: Three times a day (TID) | ORAL | Status: DC

## 2014-10-27 NOTE — Assessment & Plan Note (Signed)
Shingles. Slightly improved. Still quite symptomatic. At gabapentin. Return in 2 weeks or sooner as needed.

## 2014-10-27 NOTE — Progress Notes (Signed)
Gail Lindsey is a 44 y.o. female who presents to [facility]  today for follow-up shingles. Patient presents to clinic today for continued pain from likely shingles. She notes that following prednisone and Valtrex her symptoms have improved. She notes continued burning pain along the posterior aspect of her left ear and into the scalp. She denies any significant fevers chills nausea vomiting or diarrhea. Pain is moderate to severe and interfering with work.   No past medical history on file. Past Surgical History  Procedure Laterality Date  . Tonsillectomy    . Intrauterine device (iud) insertion  2013   Social History  Substance Use Topics  . Smoking status: Former Games developer  . Smokeless tobacco: Not on file  . Alcohol Use: 1.5 oz/week    3 drink(s) per week   family history includes COPD in her father.  ROS as above Medications: Current Outpatient Prescriptions  Medication Sig Dispense Refill  . amoxicillin-clavulanate (AUGMENTIN) 875-125 MG per tablet Take 1 tablet by mouth every 12 (twelve) hours. 14 tablet 0  . gabapentin (NEURONTIN) 300 MG capsule Take 1 capsule (300 mg total) by mouth 3 (three) times daily. 90 capsule 3  . oxyCODONE-acetaminophen (ROXICET) 5-325 MG per tablet Take 1 tablet by mouth every 6 (six) hours as needed for severe pain. 30 tablet 0  . predniSONE (STERAPRED UNI-PAK 21 TAB) 10 MG (21) TBPK tablet 6 day dosepack po 21 tablet 0  . traMADol (ULTRAM) 50 MG tablet Take 1 tablet (50 mg total) by mouth every 8 (eight) hours as needed. 30 tablet 0  . valACYclovir (VALTREX) 1000 MG tablet Take 1 tablet (1,000 mg total) by mouth 3 (three) times daily. 21 tablet 0  . [DISCONTINUED] phentermine 37.5 MG capsule Take 18 mg by mouth daily as needed.     No current facility-administered medications for this visit.   Allergies  Allergen Reactions  . Nitrofuran Derivatives   . Tetracyclines & Related      Exam:  BP 117/78 mmHg   Pulse 86  Ht  (1.702 m)  Wt 148 lb (67.132 kg)  BMI 23.17 kg/m2 Gen: Well NAD HEENT: EOMI,  MMM Lungs: Normal work of breathing. CTABL Heart: RRR no MRG Abd: NABS, Soft. Nondistended, Nontender Exts: Brisk capillary refill, warm and well perfused.  Skin: Small vesicular lesions present on the left side of the posterior scalp. Improved from prior visit.  No results found for this or any previous visit (from the past 24 hour(s)). No results found.   Please see individual assessment and plan sections.

## 2014-10-27 NOTE — Patient Instructions (Signed)
Thank you for coming in today. Start taking gabapentin at night. After few days advance to twice daily. After a few more days advance to 3 times daily. Return in 2 weeks  Call or go to the emergency room if you get worse, have trouble breathing, have chest pains, or palpitations.

## 2014-10-28 LAB — VIRAL CULTURE VIRC

## 2014-10-28 LAB — CYTOMEGALOVIRUS CULTURE

## 2015-01-27 ENCOUNTER — Encounter: Payer: Self-pay | Admitting: *Deleted

## 2015-01-27 ENCOUNTER — Emergency Department
Admission: EM | Admit: 2015-01-27 | Discharge: 2015-01-27 | Disposition: A | Discharge status: Home / Self Care | Payer: BLUE CROSS/BLUE SHIELD | Source: Home / Self Care | Attending: Family Medicine | Admitting: Family Medicine

## 2015-01-27 DIAGNOSIS — S61219A Laceration without foreign body of unspecified finger without damage to nail, initial encounter: Secondary | ICD-10-CM | POA: Diagnosis not present

## 2015-01-27 NOTE — ED Notes (Signed)
Reported pts pain level to Dr Cathren HarshBeese. Pt reports that she was drinking her second glass of wine when the injury occurred. Per Dr Cathren HarshBeese no pain medication was given. Clemens Catholichristy Nerine Pulse, LPN

## 2015-01-27 NOTE — Discharge Instructions (Signed)
Change dressing daily and apply Bacitracin ointment to wound.  Keep wound clean and dry.  Return for any signs of infection (or follow-up with family doctor):  Increasing redness, swelling, pain, heat, drainage, etc. Return in 10 days for suture removal.  May take Ibuprofen or Tylenol as needed for pain.   Laceration Care, Adult A laceration is a cut that goes through all of the layers of the skin and into the tissue that is right under the skin. Some lacerations heal on their own. Others need to be closed with stitches (sutures), staples, skin adhesive strips, or skin glue. Proper laceration care minimizes the risk of infection and helps the laceration to heal better. HOW TO CARE FOR YOUR LACERATION If sutures or staples were used:  Keep the wound clean and dry.  If you were given a bandage (dressing), you should change it at least one time per day or as told by your health care provider. You should also change it if it becomes wet or dirty.  Keep the wound completely dry for the first 24 hours or as told by your health care provider. After that time, you may shower or bathe. However, make sure that the wound is not soaked in water until after the sutures or staples have been removed.  Clean the wound one time each day or as told by your health care provider:  Wash the wound with soap and water.  Rinse the wound with water to remove all soap.  Pat the wound dry with a clean towel. Do not rub the wound.  After cleaning the wound, apply a thin layer of antibiotic ointmentas told by your health care provider. This will help to prevent infection and keep the dressing from sticking to the wound.  Have the sutures or staples removed as told by your health care provider. If skin adhesive strips were used:  Keep the wound clean and dry.  If you were given a bandage (dressing), you should change it at least one time per day or as told by your health care provider. You should also change it if  it becomes dirty or wet.  Do not get the skin adhesive strips wet. You may shower or bathe, but be careful to keep the wound dry.  If the wound gets wet, pat it dry with a clean towel. Do not rub the wound.  Skin adhesive strips fall off on their own. You may trim the strips as the wound heals. Do not remove skin adhesive strips that are still stuck to the wound. They will fall off in time. If skin glue was used:  Try to keep the wound dry, but you may briefly wet it in the shower or bath. Do not soak the wound in water, such as by swimming.  After you have showered or bathed, gently pat the wound dry with a clean towel. Do not rub the wound.  Do not do any activities that will make you sweat heavily until the skin glue has fallen off on its own.  Do not apply liquid, cream, or ointment medicine to the wound while the skin glue is in place. Using those may loosen the film before the wound has healed.  If you were given a bandage (dressing), you should change it at least one time per day or as told by your health care provider. You should also change it if it becomes dirty or wet.  If a dressing is placed over the wound, be careful not  to apply tape directly over the skin glue. Doing that may cause the glue to be pulled off before the wound has healed.  Do not pick at the glue. The skin glue usually remains in place for 5-10 days, then it falls off of the skin. General Instructions  Take over-the-counter and prescription medicines only as told by your health care provider.  If you were prescribed an antibiotic medicine or ointment, take or apply it as told by your doctor. Do not stop using it even if your condition improves.  To help prevent scarring, make sure to cover your wound with sunscreen whenever you are outside after stitches are removed, after adhesive strips are removed, or when glue remains in place and the wound is healed. Make sure to wear a sunscreen of at least 30  SPF.  Do not scratch or pick at the wound.  Keep all follow-up visits as told by your health care provider. This is important.  Check your wound every day for signs of infection. Watch for:  Redness, swelling, or pain.  Fluid, blood, or pus.  Raise (elevate) the injured area above the level of your heart while you are sitting or lying down, if possible. SEEK MEDICAL CARE IF:  You received a tetanus shot and you have swelling, severe pain, redness, or bleeding at the injection site.  You have a fever.  A wound that was closed breaks open.  You notice a bad smell coming from your wound or your dressing.  You notice something coming out of the wound, such as wood or glass.  Your pain is not controlled with medicine.  You have increased redness, swelling, or pain at the site of your wound.  You have fluid, blood, or pus coming from your wound.  You notice a change in the color of your skin near your wound.  You need to change the dressing frequently due to fluid, blood, or pus draining from the wound.  You develop a new rash.  You develop numbness around the wound. SEEK IMMEDIATE MEDICAL CARE IF:  You develop severe swelling around the wound.  Your pain suddenly increases and is severe.  You develop painful lumps near the wound or on skin that is anywhere on your body.  You have a red streak going away from your wound.  The wound is on your hand or foot and you cannot properly move a finger or toe.  The wound is on your hand or foot and you notice that your fingers or toes look pale or bluish.   This information is not intended to replace advice given to you by your health care provider. Make sure you discuss any questions you have with your health care provider.   Document Released: 02/20/2005 Document Revised: 07/07/2014 Document Reviewed: 02/16/2014 Elsevier Interactive Patient Education [employer]2016 [employer] Inc.

## 2015-01-27 NOTE — ED Notes (Signed)
Pt c/o LT 2nd finger laceration x 20 minutes ago. Tdap 2012.

## 2015-01-27 NOTE — ED Provider Notes (Signed)
CSN: [id number]     Arrival date & time 01/27/15  1945 History   First MD Initiated Contact with Patient 01/27/15 1951     Chief Complaint  Patient presents with  . Extremity Laceration      HPI Comments: Patient was cutting collard greens 20 minutes ago when she lacerated her left second fingertip with her knife.  Her last Tdap was in 2012.  Patient is a 44 y.o. female presenting with skin laceration. The history is provided by the patient.  Laceration Location:  Finger Finger laceration location:  L index finger Length (cm):  3 Depth:  Through dermis Quality comment:  Flap laceration Bleeding: controlled   Laceration mechanism:  Knife Pain details:    Quality:  Aching   Severity:  Mild   Timing:  Constant   Progression:  Unchanged Foreign body present:  No foreign bodies Worsened by:  Movement Ineffective treatments:  None tried Tetanus status:  Up to date   History reviewed. No pertinent past medical history. Past Surgical History  Procedure Laterality Date  . Tonsillectomy    . Intrauterine device (iud) insertion  2013   Family History  Problem Relation Age of Onset  . COPD Father    Social History  Substance Use Topics  . Smoking status: Former Games developer  . Smokeless tobacco: None  . Alcohol Use: 1.5 oz/week    3 drink(s) per week   OB History    No data available     Review of Systems  Allergies  Nitrofuran derivatives and Tetracyclines & related  Home Medications   Prior to Admission medications   Medication Sig Start Date End Date Taking? Authorizing Provider  valACYclovir (VALTREX) 1000 MG tablet Take 1 tablet (1,000 mg total) by mouth 3 (three) times daily. 10/19/14   Rodolph Bong, MD   Meds Ordered and Administered this Visit  Medications - No data to display  BP 109/78 mmHg  Pulse 86  Temp(Src) 98.3 F (36.8 C) (Oral)  Resp 16  Ht  (1.702 m)  Wt 135 lb (61.236 kg)  BMI 21.14 kg/m2  SpO2 97% No data found.   Physical Exam    Constitutional: She is oriented to person, place, and time. She appears well-developed and well-nourished. No distress.  HENT:  Head: Normocephalic.  Eyes: Pupils are equal, round, and reactive to light.  Musculoskeletal:       Left hand: She exhibits tenderness and laceration. She exhibits normal range of motion, no bony tenderness, normal two-point discrimination, normal capillary refill, no deformity and no swelling.       Hands: Left second finger distal phalanx has a 3cm long flap laceration.  DIP joint has full range of motion.  Distal neurovascular function is intact.   Neurological: She is alert and oriented to person, place, and time.  Skin: Skin is warm and dry.  Nursing note and vitals reviewed.   ED Course  Procedures  Laceration Repair Discussed benefits and risks of procedure and verbal consent obtained. Using sterile technique and digital 2% lidocaine without epinephrine, cleansed wound with Betadine followed by copious lavage with normal saline.  Wound carefully inspected for debris and foreign bodies; none found.  Wound closed with #7, 5-0 interrupted nylon sutures.  Bacitracin and non-stick sterile dressing applied.  Wound precautions explained to patient.  Return for suture removal in 10 days.     MDM   1. Laceration of finger, initial encounter     Change dressing daily and apply  Bacitracin ointment to wound.  Keep wound clean and dry.  Return for any signs of infection (or follow-up with family doctor):  Increasing redness, swelling, pain, heat, drainage, etc. Return in 10 days for suture removal.  May take Ibuprofen or Tylenol as needed for pain.    Lattie HawStephen A Kaydie Petsch, MD 01/27/15 2051

## 2015-08-10 DIAGNOSIS — H00035 Abscess of left lower eyelid: Secondary | ICD-10-CM | POA: Diagnosis not present

## 2015-08-12 DIAGNOSIS — H00035 Abscess of left lower eyelid: Secondary | ICD-10-CM | POA: Diagnosis not present

## 2015-08-24 DIAGNOSIS — M79671 Pain in right foot: Secondary | ICD-10-CM | POA: Diagnosis not present

## 2015-08-24 DIAGNOSIS — M79672 Pain in left foot: Secondary | ICD-10-CM | POA: Diagnosis not present

## 2015-08-24 DIAGNOSIS — M2011 Hallux valgus (acquired), right foot: Secondary | ICD-10-CM | POA: Diagnosis not present

## 2015-08-24 DIAGNOSIS — M2012 Hallux valgus (acquired), left foot: Secondary | ICD-10-CM | POA: Diagnosis not present

## 2015-10-07 DIAGNOSIS — D045 Carcinoma in situ of skin of trunk: Secondary | ICD-10-CM | POA: Diagnosis not present

## 2015-10-07 DIAGNOSIS — L578 Other skin changes due to chronic exposure to nonionizing radiation: Secondary | ICD-10-CM | POA: Diagnosis not present

## 2015-10-07 DIAGNOSIS — D485 Neoplasm of uncertain behavior of skin: Secondary | ICD-10-CM | POA: Diagnosis not present

## 2015-10-07 DIAGNOSIS — L814 Other melanin hyperpigmentation: Secondary | ICD-10-CM | POA: Diagnosis not present

## 2015-10-11 DIAGNOSIS — H04123 Dry eye syndrome of bilateral lacrimal glands: Secondary | ICD-10-CM | POA: Diagnosis not present

## 2015-10-11 DIAGNOSIS — H0015 Chalazion left lower eyelid: Secondary | ICD-10-CM | POA: Diagnosis not present

## 2015-10-21 DIAGNOSIS — N390 Urinary tract infection, site not specified: Secondary | ICD-10-CM | POA: Diagnosis not present

## 2015-10-27 DIAGNOSIS — Z13220 Encounter for screening for lipoid disorders: Secondary | ICD-10-CM | POA: Diagnosis not present

## 2015-10-27 DIAGNOSIS — Z131 Encounter for screening for diabetes mellitus: Secondary | ICD-10-CM | POA: Diagnosis not present

## 2015-10-27 DIAGNOSIS — N915 Oligomenorrhea, unspecified: Secondary | ICD-10-CM | POA: Diagnosis not present

## 2015-10-27 DIAGNOSIS — Z01419 Encounter for gynecological examination (general) (routine) without abnormal findings: Secondary | ICD-10-CM | POA: Diagnosis not present

## 2015-10-27 DIAGNOSIS — Z1231 Encounter for screening mammogram for malignant neoplasm of breast: Secondary | ICD-10-CM | POA: Diagnosis not present

## 2015-10-27 DIAGNOSIS — Z6820 Body mass index (BMI) 20.0-20.9, adult: Secondary | ICD-10-CM | POA: Diagnosis not present

## 2015-10-29 DIAGNOSIS — D099 Carcinoma in situ, unspecified: Secondary | ICD-10-CM | POA: Diagnosis not present

## 2015-11-11 DIAGNOSIS — H0015 Chalazion left lower eyelid: Secondary | ICD-10-CM | POA: Diagnosis not present

## 2015-11-30 DIAGNOSIS — D1800 Hemangioma unspecified site: Secondary | ICD-10-CM | POA: Diagnosis not present

## 2015-11-30 DIAGNOSIS — Z85828 Personal history of other malignant neoplasm of skin: Secondary | ICD-10-CM | POA: Diagnosis not present

## 2015-11-30 DIAGNOSIS — D225 Melanocytic nevi of trunk: Secondary | ICD-10-CM | POA: Diagnosis not present

## 2015-11-30 DIAGNOSIS — L570 Actinic keratosis: Secondary | ICD-10-CM | POA: Diagnosis not present

## 2015-11-30 DIAGNOSIS — L814 Other melanin hyperpigmentation: Secondary | ICD-10-CM | POA: Diagnosis not present

## 2016-01-18 DIAGNOSIS — L570 Actinic keratosis: Secondary | ICD-10-CM | POA: Diagnosis not present

## 2016-06-20 DIAGNOSIS — N3000 Acute cystitis without hematuria: Secondary | ICD-10-CM | POA: Diagnosis not present

## 2016-06-27 DIAGNOSIS — R300 Dysuria: Secondary | ICD-10-CM | POA: Diagnosis not present

## 2016-08-14 DIAGNOSIS — M2241 Chondromalacia patellae, right knee: Secondary | ICD-10-CM | POA: Diagnosis not present

## 2016-11-03 DIAGNOSIS — Z13220 Encounter for screening for lipoid disorders: Secondary | ICD-10-CM | POA: Diagnosis not present

## 2016-11-03 DIAGNOSIS — Z131 Encounter for screening for diabetes mellitus: Secondary | ICD-10-CM | POA: Diagnosis not present

## 2016-11-03 DIAGNOSIS — Z30432 Encounter for removal of intrauterine contraceptive device: Secondary | ICD-10-CM | POA: Diagnosis not present

## 2016-11-03 DIAGNOSIS — B977 Papillomavirus as the cause of diseases classified elsewhere: Secondary | ICD-10-CM | POA: Diagnosis not present

## 2016-11-03 DIAGNOSIS — N951 Menopausal and female climacteric states: Secondary | ICD-10-CM | POA: Diagnosis not present

## 2016-11-03 DIAGNOSIS — Z1231 Encounter for screening mammogram for malignant neoplasm of breast: Secondary | ICD-10-CM | POA: Diagnosis not present

## 2016-11-03 DIAGNOSIS — Z6821 Body mass index (BMI) 21.0-21.9, adult: Secondary | ICD-10-CM | POA: Diagnosis not present

## 2016-11-03 DIAGNOSIS — Z1151 Encounter for screening for human papillomavirus (HPV): Secondary | ICD-10-CM | POA: Diagnosis not present

## 2016-11-03 DIAGNOSIS — Z01419 Encounter for gynecological examination (general) (routine) without abnormal findings: Secondary | ICD-10-CM | POA: Diagnosis not present

## 2016-12-12 DIAGNOSIS — L820 Inflamed seborrheic keratosis: Secondary | ICD-10-CM | POA: Diagnosis not present

## 2016-12-12 DIAGNOSIS — D225 Melanocytic nevi of trunk: Secondary | ICD-10-CM | POA: Diagnosis not present

## 2016-12-12 DIAGNOSIS — L570 Actinic keratosis: Secondary | ICD-10-CM | POA: Diagnosis not present

## 2016-12-12 DIAGNOSIS — Z85828 Personal history of other malignant neoplasm of skin: Secondary | ICD-10-CM | POA: Diagnosis not present

## 2016-12-15 DIAGNOSIS — N882 Stricture and stenosis of cervix uteri: Secondary | ICD-10-CM | POA: Diagnosis not present

## 2016-12-15 DIAGNOSIS — Z30430 Encounter for insertion of intrauterine contraceptive device: Secondary | ICD-10-CM | POA: Diagnosis not present

## 2017-01-09 DIAGNOSIS — Z30431 Encounter for routine checking of intrauterine contraceptive device: Secondary | ICD-10-CM | POA: Diagnosis not present

## 2017-04-03 DIAGNOSIS — L570 Actinic keratosis: Secondary | ICD-10-CM | POA: Diagnosis not present

## 2017-04-03 DIAGNOSIS — L821 Other seborrheic keratosis: Secondary | ICD-10-CM | POA: Diagnosis not present

## 2017-04-03 DIAGNOSIS — L859 Epidermal thickening, unspecified: Secondary | ICD-10-CM | POA: Diagnosis not present

## 2017-04-04 DIAGNOSIS — H5203 Hypermetropia, bilateral: Secondary | ICD-10-CM | POA: Diagnosis not present

## 2017-07-24 DIAGNOSIS — H0015 Chalazion left lower eyelid: Secondary | ICD-10-CM | POA: Diagnosis not present

## 2017-12-19 DIAGNOSIS — L570 Actinic keratosis: Secondary | ICD-10-CM | POA: Diagnosis not present

## 2017-12-19 DIAGNOSIS — Z85828 Personal history of other malignant neoplasm of skin: Secondary | ICD-10-CM | POA: Diagnosis not present

## 2017-12-19 DIAGNOSIS — L821 Other seborrheic keratosis: Secondary | ICD-10-CM | POA: Diagnosis not present

## 2017-12-19 DIAGNOSIS — L820 Inflamed seborrheic keratosis: Secondary | ICD-10-CM | POA: Diagnosis not present

## 2017-12-19 DIAGNOSIS — D225 Melanocytic nevi of trunk: Secondary | ICD-10-CM | POA: Diagnosis not present

## 2017-12-19 DIAGNOSIS — L814 Other melanin hyperpigmentation: Secondary | ICD-10-CM | POA: Diagnosis not present

## 2018-01-09 DIAGNOSIS — L570 Actinic keratosis: Secondary | ICD-10-CM | POA: Diagnosis not present

## 2018-01-16 DIAGNOSIS — Z130 Encounter for screening for diseases of the blood and blood-forming organs and certain disorders involving the immune mechanism: Secondary | ICD-10-CM | POA: Diagnosis not present

## 2018-01-16 DIAGNOSIS — Z01419 Encounter for gynecological examination (general) (routine) without abnormal findings: Secondary | ICD-10-CM | POA: Diagnosis not present

## 2018-01-16 DIAGNOSIS — N951 Menopausal and female climacteric states: Secondary | ICD-10-CM | POA: Diagnosis not present

## 2018-01-16 DIAGNOSIS — Z124 Encounter for screening for malignant neoplasm of cervix: Secondary | ICD-10-CM | POA: Diagnosis not present

## 2018-01-16 DIAGNOSIS — Z6823 Body mass index (BMI) 23.0-23.9, adult: Secondary | ICD-10-CM | POA: Diagnosis not present

## 2018-01-16 DIAGNOSIS — B977 Papillomavirus as the cause of diseases classified elsewhere: Secondary | ICD-10-CM | POA: Diagnosis not present

## 2018-01-16 DIAGNOSIS — Z13220 Encounter for screening for lipoid disorders: Secondary | ICD-10-CM | POA: Diagnosis not present

## 2018-01-16 DIAGNOSIS — Z0000 Encounter for general adult medical examination without abnormal findings: Secondary | ICD-10-CM | POA: Diagnosis not present

## 2018-01-16 DIAGNOSIS — Z1231 Encounter for screening mammogram for malignant neoplasm of breast: Secondary | ICD-10-CM | POA: Diagnosis not present

## 2018-01-16 DIAGNOSIS — Z1329 Encounter for screening for other suspected endocrine disorder: Secondary | ICD-10-CM | POA: Diagnosis not present

## 2018-04-14 DIAGNOSIS — J00 Acute nasopharyngitis [common cold]: Secondary | ICD-10-CM | POA: Diagnosis not present

## 2018-04-14 DIAGNOSIS — R0981 Nasal congestion: Secondary | ICD-10-CM | POA: Diagnosis not present

## 2018-04-14 DIAGNOSIS — J029 Acute pharyngitis, unspecified: Secondary | ICD-10-CM | POA: Diagnosis not present

## 2018-07-16 DIAGNOSIS — H524 Presbyopia: Secondary | ICD-10-CM | POA: Diagnosis not present

## 2018-12-02 DIAGNOSIS — M545 Low back pain: Secondary | ICD-10-CM | POA: Diagnosis not present

## 2019-01-14 DIAGNOSIS — D225 Melanocytic nevi of trunk: Secondary | ICD-10-CM | POA: Diagnosis not present

## 2019-01-14 DIAGNOSIS — L570 Actinic keratosis: Secondary | ICD-10-CM | POA: Diagnosis not present

## 2019-01-14 DIAGNOSIS — Z85828 Personal history of other malignant neoplasm of skin: Secondary | ICD-10-CM | POA: Diagnosis not present

## 2019-01-14 DIAGNOSIS — L821 Other seborrheic keratosis: Secondary | ICD-10-CM | POA: Diagnosis not present

## 2019-01-14 DIAGNOSIS — L814 Other melanin hyperpigmentation: Secondary | ICD-10-CM | POA: Diagnosis not present

## 2019-02-03 DIAGNOSIS — Z20828 Contact with and (suspected) exposure to other viral communicable diseases: Secondary | ICD-10-CM | POA: Diagnosis not present

## 2019-02-26 DIAGNOSIS — B977 Papillomavirus as the cause of diseases classified elsewhere: Secondary | ICD-10-CM | POA: Diagnosis not present

## 2019-02-26 DIAGNOSIS — Z1151 Encounter for screening for human papillomavirus (HPV): Secondary | ICD-10-CM | POA: Diagnosis not present

## 2019-02-26 DIAGNOSIS — Z131 Encounter for screening for diabetes mellitus: Secondary | ICD-10-CM | POA: Diagnosis not present

## 2019-02-26 DIAGNOSIS — Z01419 Encounter for gynecological examination (general) (routine) without abnormal findings: Secondary | ICD-10-CM | POA: Diagnosis not present

## 2019-02-26 DIAGNOSIS — N951 Menopausal and female climacteric states: Secondary | ICD-10-CM | POA: Diagnosis not present

## 2019-02-26 DIAGNOSIS — Z1231 Encounter for screening mammogram for malignant neoplasm of breast: Secondary | ICD-10-CM | POA: Diagnosis not present

## 2019-02-26 DIAGNOSIS — N72 Inflammatory disease of cervix uteri: Secondary | ICD-10-CM | POA: Diagnosis not present

## 2019-02-26 DIAGNOSIS — Z6820 Body mass index (BMI) 20.0-20.9, adult: Secondary | ICD-10-CM | POA: Diagnosis not present

## 2019-02-26 DIAGNOSIS — Z0000 Encounter for general adult medical examination without abnormal findings: Secondary | ICD-10-CM | POA: Diagnosis not present

## 2019-02-26 DIAGNOSIS — Z130 Encounter for screening for diseases of the blood and blood-forming organs and certain disorders involving the immune mechanism: Secondary | ICD-10-CM | POA: Diagnosis not present

## 2019-02-26 DIAGNOSIS — Z13220 Encounter for screening for lipoid disorders: Secondary | ICD-10-CM | POA: Diagnosis not present

## 2019-02-26 DIAGNOSIS — Z1329 Encounter for screening for other suspected endocrine disorder: Secondary | ICD-10-CM | POA: Diagnosis not present

## 2019-02-26 DIAGNOSIS — Z124 Encounter for screening for malignant neoplasm of cervix: Secondary | ICD-10-CM | POA: Diagnosis not present

## 2019-03-19 DIAGNOSIS — Z1211 Encounter for screening for malignant neoplasm of colon: Secondary | ICD-10-CM | POA: Diagnosis not present

## 2019-03-19 DIAGNOSIS — Z1212 Encounter for screening for malignant neoplasm of rectum: Secondary | ICD-10-CM | POA: Diagnosis not present

## 2019-03-27 DIAGNOSIS — N951 Menopausal and female climacteric states: Secondary | ICD-10-CM | POA: Diagnosis not present

## 2019-07-28 DIAGNOSIS — H524 Presbyopia: Secondary | ICD-10-CM | POA: Diagnosis not present

## 2019-09-30 DIAGNOSIS — Z23 Encounter for immunization: Secondary | ICD-10-CM | POA: Diagnosis not present

## 2019-09-30 DIAGNOSIS — Z79899 Other long term (current) drug therapy: Secondary | ICD-10-CM | POA: Diagnosis not present

## 2019-09-30 DIAGNOSIS — W260XXA Contact with knife, initial encounter: Secondary | ICD-10-CM | POA: Diagnosis not present

## 2019-09-30 DIAGNOSIS — K5900 Constipation, unspecified: Secondary | ICD-10-CM | POA: Diagnosis not present

## 2019-09-30 DIAGNOSIS — Z1211 Encounter for screening for malignant neoplasm of colon: Secondary | ICD-10-CM | POA: Diagnosis not present

## 2019-09-30 DIAGNOSIS — S61012A Laceration without foreign body of left thumb without damage to nail, initial encounter: Secondary | ICD-10-CM | POA: Diagnosis not present

## 2019-09-30 DIAGNOSIS — S61011A Laceration without foreign body of right thumb without damage to nail, initial encounter: Secondary | ICD-10-CM | POA: Diagnosis not present

## 2019-09-30 DIAGNOSIS — G8911 Acute pain due to trauma: Secondary | ICD-10-CM | POA: Diagnosis not present

## 2019-09-30 DIAGNOSIS — Z888 Allergy status to other drugs, medicaments and biological substances status: Secondary | ICD-10-CM | POA: Diagnosis not present

## 2019-09-30 DIAGNOSIS — Y93G1 Activity, food preparation and clean up: Secondary | ICD-10-CM | POA: Diagnosis not present

## 2019-10-09 DIAGNOSIS — H04123 Dry eye syndrome of bilateral lacrimal glands: Secondary | ICD-10-CM | POA: Diagnosis not present

## 2019-12-25 DIAGNOSIS — R0789 Other chest pain: Secondary | ICD-10-CM | POA: Diagnosis not present

## 2019-12-25 DIAGNOSIS — R079 Chest pain, unspecified: Secondary | ICD-10-CM | POA: Diagnosis not present

## 2019-12-25 DIAGNOSIS — Z881 Allergy status to other antibiotic agents status: Secondary | ICD-10-CM | POA: Diagnosis not present

## 2019-12-25 DIAGNOSIS — Z79899 Other long term (current) drug therapy: Secondary | ICD-10-CM | POA: Diagnosis not present

## 2019-12-25 DIAGNOSIS — Z7982 Long term (current) use of aspirin: Secondary | ICD-10-CM | POA: Diagnosis not present

## 2019-12-25 DIAGNOSIS — R0602 Shortness of breath: Secondary | ICD-10-CM | POA: Diagnosis not present

## 2020-01-11 DIAGNOSIS — R000 Tachycardia, unspecified: Secondary | ICD-10-CM | POA: Diagnosis not present

## 2020-01-11 DIAGNOSIS — F10929 Alcohol use, unspecified with intoxication, unspecified: Secondary | ICD-10-CM | POA: Diagnosis not present

## 2020-01-11 DIAGNOSIS — S01419A Laceration without foreign body of unspecified cheek and temporomandibular area, initial encounter: Secondary | ICD-10-CM | POA: Diagnosis not present

## 2020-01-11 DIAGNOSIS — S0031XA Abrasion of nose, initial encounter: Secondary | ICD-10-CM | POA: Diagnosis not present

## 2020-01-11 DIAGNOSIS — S199XXA Unspecified injury of neck, initial encounter: Secondary | ICD-10-CM | POA: Diagnosis not present

## 2020-01-11 DIAGNOSIS — S0181XA Laceration without foreign body of other part of head, initial encounter: Secondary | ICD-10-CM | POA: Diagnosis not present

## 2020-01-11 DIAGNOSIS — S0990XA Unspecified injury of head, initial encounter: Secondary | ICD-10-CM | POA: Diagnosis not present

## 2020-01-11 DIAGNOSIS — R58 Hemorrhage, not elsewhere classified: Secondary | ICD-10-CM | POA: Diagnosis not present

## 2020-01-21 DIAGNOSIS — L570 Actinic keratosis: Secondary | ICD-10-CM | POA: Diagnosis not present

## 2020-01-21 DIAGNOSIS — Z85828 Personal history of other malignant neoplasm of skin: Secondary | ICD-10-CM | POA: Diagnosis not present

## 2020-01-21 DIAGNOSIS — D485 Neoplasm of uncertain behavior of skin: Secondary | ICD-10-CM | POA: Diagnosis not present

## 2020-01-21 DIAGNOSIS — L821 Other seborrheic keratosis: Secondary | ICD-10-CM | POA: Diagnosis not present

## 2020-01-21 DIAGNOSIS — D225 Melanocytic nevi of trunk: Secondary | ICD-10-CM | POA: Diagnosis not present

## 2020-01-21 DIAGNOSIS — L814 Other melanin hyperpigmentation: Secondary | ICD-10-CM | POA: Diagnosis not present

## 2020-02-18 DIAGNOSIS — Z20822 Contact with and (suspected) exposure to covid-19: Secondary | ICD-10-CM | POA: Diagnosis not present

## 2020-02-18 DIAGNOSIS — Z03818 Encounter for observation for suspected exposure to other biological agents ruled out: Secondary | ICD-10-CM | POA: Diagnosis not present
